# Patient Record
Sex: Female | Born: 1990 | Race: Black or African American | Hispanic: No | Marital: Single | Smoking: Current some day smoker
Health system: Southern US, Community
[De-identification: ages and names within clinical notes are randomized; demographics above are authoritative.]

## PROBLEM LIST (undated history)

## (undated) DIAGNOSIS — K2970 Gastritis, unspecified, without bleeding: Secondary | ICD-10-CM

## (undated) DIAGNOSIS — B009 Herpesviral infection, unspecified: Secondary | ICD-10-CM

## (undated) DIAGNOSIS — T7840XA Allergy, unspecified, initial encounter: Secondary | ICD-10-CM

## (undated) HISTORY — PX: NO PAST SURGERIES: SHX2092

## (undated) HISTORY — DX: Allergy, unspecified, initial encounter: T78.40XA

## (undated) HISTORY — DX: Herpesviral infection, unspecified: B00.9

---
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
## 2013-08-11 ENCOUNTER — Emergency Department (HOSPITAL_COMMUNITY)
Admission: EM | Admit: 2013-08-11 | Discharge: 2013-08-12 | Disposition: A | Discharge status: Home / Self Care | Payer: Medicaid Other | Attending: Emergency Medicine | Admitting: Emergency Medicine

## 2013-08-11 ENCOUNTER — Encounter (HOSPITAL_COMMUNITY): Payer: Self-pay | Admitting: Emergency Medicine

## 2013-08-11 DIAGNOSIS — R112 Nausea with vomiting, unspecified: Secondary | ICD-10-CM

## 2013-08-11 DIAGNOSIS — Z3202 Encounter for pregnancy test, result negative: Secondary | ICD-10-CM | POA: Insufficient documentation

## 2013-08-11 DIAGNOSIS — R197 Diarrhea, unspecified: Secondary | ICD-10-CM | POA: Insufficient documentation

## 2013-08-11 DIAGNOSIS — F17200 Nicotine dependence, unspecified, uncomplicated: Secondary | ICD-10-CM | POA: Insufficient documentation

## 2013-08-11 DIAGNOSIS — R1013 Epigastric pain: Secondary | ICD-10-CM | POA: Insufficient documentation

## 2013-08-11 DIAGNOSIS — R109 Unspecified abdominal pain: Secondary | ICD-10-CM

## 2013-08-11 LAB — CBC WITH DIFFERENTIAL/PLATELET
Basophils Absolute: 0 10*3/uL (ref 0.0–0.1)
Basophils Relative: 0 % (ref 0–1)
Eosinophils Absolute: 0 10*3/uL (ref 0.0–0.7)
Eosinophils Relative: 0 % (ref 0–5)
HCT: 39 % (ref 36.0–46.0)
Lymphocytes Relative: 6 % — ABNORMAL LOW (ref 12–46)
Lymphs Abs: 0.5 10*3/uL — ABNORMAL LOW (ref 0.7–4.0)
MCH: 28.1 pg (ref 26.0–34.0)
MCHC: 33.3 g/dL (ref 30.0–36.0)
MCV: 84.2 fL (ref 78.0–100.0)
Monocytes Absolute: 0.3 10*3/uL (ref 0.1–1.0)
Neutrophils Relative %: 90 % — ABNORMAL HIGH (ref 43–77)
RDW: 13.5 % (ref 11.5–15.5)

## 2013-08-11 LAB — URINALYSIS, ROUTINE W REFLEX MICROSCOPIC
Glucose, UA: NEGATIVE mg/dL
Hgb urine dipstick: NEGATIVE
Leukocytes, UA: NEGATIVE
Protein, ur: NEGATIVE mg/dL
Specific Gravity, Urine: 1.029 (ref 1.005–1.030)
Urobilinogen, UA: 1 mg/dL (ref 0.0–1.0)
pH: 7.5 (ref 5.0–8.0)

## 2013-08-11 LAB — COMPREHENSIVE METABOLIC PANEL
ALT: 10 U/L (ref 0–35)
AST: 13 U/L (ref 0–37)
Albumin: 4 g/dL (ref 3.5–5.2)
BUN: 12 mg/dL (ref 6–23)
CO2: 24 mEq/L (ref 19–32)
Calcium: 9.1 mg/dL (ref 8.4–10.5)
Creatinine, Ser: 0.68 mg/dL (ref 0.50–1.10)
GFR calc non Af Amer: >90 mL/min (ref >90)

## 2013-08-11 LAB — POCT PREGNANCY, URINE: Preg Test, Ur: NEGATIVE

## 2013-08-11 MED ORDER — ONDANSETRON 4 MG PO TBDP
4.0000 mg | ORAL_TABLET | Freq: Once | ORAL | Status: AC
  Administered 2013-08-11: 4 mg via ORAL
  Filled 2013-08-11: qty 1

## 2013-08-11 MED ORDER — KETOROLAC TROMETHAMINE 30 MG/ML IJ SOLN
60.0000 mg | Freq: Once | INTRAMUSCULAR | Status: AC
  Administered 2013-08-12: 60 mg via INTRAMUSCULAR
  Filled 2013-08-11: qty 2

## 2013-08-11 MED ORDER — DICYCLOMINE HCL 10 MG PO CAPS
20.0000 mg | ORAL_CAPSULE | Freq: Once | ORAL | Status: AC
  Administered 2013-08-12: 20 mg via ORAL
  Filled 2013-08-11: qty 2

## 2013-08-11 MED ORDER — ONDANSETRON HCL 4 MG/2ML IJ SOLN: 4.0000 mg | Freq: Once | INTRAMUSCULAR | Status: DC

## 2013-08-11 MED ORDER — DICYCLOMINE HCL 10 MG/ML IM SOLN: 20.0000 mg | Freq: Once | INTRAMUSCULAR | Status: DC

## 2013-08-11 MED ORDER — GI COCKTAIL ~~LOC~~
30.0000 mL | Freq: Once | ORAL | Status: AC
  Administered 2013-08-12: 30 mL via ORAL
  Filled 2013-08-11: qty 30

## 2013-08-11 NOTE — ED Notes (Signed)
Pt c/o epigastric pain with nausea vomiting and diarrhea.  Onset approx 10am today.   St's pain radiates through to back.  Pt st's she ate spicy wings at 1:00am today.

## 2013-08-12 MED ORDER — ONDANSETRON 8 MG PO TBDP: 8.0000 mg | ORAL_TABLET | Freq: Three times a day (TID) | ORAL | Status: DC | PRN

## 2013-08-12 MED ORDER — DICYCLOMINE HCL 20 MG PO TABS: 20.0000 mg | ORAL_TABLET | Freq: Four times a day (QID) | ORAL | Status: DC | PRN

## 2013-08-12 NOTE — ED Notes (Signed)
Pt given cup of ginger ale for fluid challenge. Tolerating well at this moment but will re-assess in 15 minutes.

## 2013-08-12 NOTE — ED Provider Notes (Signed)
CSN: [id number]     Arrival date & time 08/11/13  1940 History   First MD Initiated Contact with Patient 08/11/13 2301     Chief Complaint  Patient presents with  . Abdominal Pain   (Consider location/radiation/quality/duration/timing/severity/associated sxs/prior Treatment) HPI 22 year old female presents to emergency room with complaint of diffuse abdominal pain, nausea, vomiting, and diarrhea.  Today.  Patient reports she had hot wings.  Last night at 1 AM, was not able to complete all of them.  After waking at 10 AM she developed symptoms.  Pain is crampy in nature, radiates through to her back.  She denies any fever or chills.  No known sick contacts.  No urinary symptoms, no vaginal complaints.  Nausea has resolved with ODT Zofran given in triage. Past Medical History  Diagnosis Date  . Allergy    History reviewed. No pertinent past surgical history. Family History  Problem Relation Age of Onset  . Hypertension Mother   . Diabetes Maternal Grandmother   . Hypertension Maternal Grandmother    History  Substance Use Topics  . Smoking status: Current Some Day Smoker -- 0.10 packs/day    Types: Cigars  . Smokeless tobacco: Not on file     Comment: Smokes Black and Milds  . Alcohol Use: No   OB History   Grav Para Term Preterm Abortions TAB SAB Ect Mult Living                 Review of Systems  All other systems reviewed and are negative.    See History of Present Illness; otherwise all other systems are reviewed and negative Allergies  Review of patient's allergies indicates no known allergies.  Home Medications  No current outpatient prescriptions on file. BP 112/61  Pulse 69  Temp(Src) 98.3 F (36.8 C) (Oral)  Resp 18  SpO2 100% Physical Exam  Nursing note and vitals reviewed. Constitutional: She is oriented to person, place, and time. She appears well-developed and well-nourished.  HENT:  Head: Normocephalic and atraumatic.  Nose: Nose normal.   Mouth/Throat: Oropharynx is clear and moist.  Eyes: Conjunctivae and EOM are normal. Pupils are equal, round, and reactive to light.  Neck: Normal range of motion. Neck supple. No JVD present. No tracheal deviation present. No thyromegaly present.  Cardiovascular: Normal rate, regular rhythm, normal heart sounds and intact distal pulses.  Exam reveals no gallop and no friction rub.   No murmur heard. Pulmonary/Chest: Effort normal and breath sounds normal. No stridor. No respiratory distress. She has no wheezes. She has no rales. She exhibits no tenderness.  Abdominal: Soft. Bowel sounds are normal. She exhibits no distension and no mass. There is tenderness (mild diffuse abdominal pain). There is no rebound and no guarding.  Musculoskeletal: Normal range of motion. She exhibits no edema and no tenderness.  Lymphadenopathy:    She has no cervical adenopathy.  Neurological: She is alert and oriented to person, place, and time. She exhibits normal muscle tone. Coordination normal.  Skin: Skin is warm and dry. No rash noted. No erythema. No pallor.  Psychiatric: She has a normal mood and affect. Her behavior is normal. Judgment and thought content normal.    ED Course  Procedures (including critical care time) Labs Review Labs Reviewed  CBC WITH DIFFERENTIAL - Abnormal; Notable for the following:    Neutrophils Relative % 90 (*)    Lymphocytes Relative 6 (*)    Lymphs Abs 0.5 (*)    All other components within normal  limits  URINALYSIS, ROUTINE W REFLEX MICROSCOPIC - Abnormal; Notable for the following:    Ketones, ur 40 (*)    All other components within normal limits  COMPREHENSIVE METABOLIC PANEL  POCT PREGNANCY, URINE   Imaging Review No results found.  EKG Interpretation    Date/Time:  Wednesday August 11 2013 19:46:14 EST Ventricular Rate:  70 PR Interval:  162 QRS Duration: 84 QT Interval:  400 QTC Calculation: 432 R Axis:   53 Text Interpretation:  Normal sinus  rhythm with sinus arrhythmia Cannot rule out Anterior infarct , age undetermined Abnormal ECG No old tracing to compare Confirmed by Lahna Nath  MD, Wylie Coon ([id number]) on 08/11/2013 11:05:20 PM            MDM   1. Nausea vomiting and diarrhea   2. Abdominal cramping    21 year old female with nausea, vomiting, and diarrhea.  Labs are unremarkable.  She has mild ketones in her urine.  Plan for until for abdominal cramping, by mouth challenge.  At home with same.    Olivia Mackie, MD 08/12/13 [phone]

## 2013-08-12 NOTE — ED Notes (Signed)
Pt tolerating oral fluids well, denies nausea/vomiting. Dr. Norlene Campbell made aware.

## 2014-03-09 ENCOUNTER — Emergency Department (HOSPITAL_COMMUNITY)
Admission: EM | Admit: 2014-03-09 | Discharge: 2014-03-09 | Disposition: A | Discharge status: Home / Self Care | Payer: BC Managed Care – PPO | Attending: Emergency Medicine | Admitting: Emergency Medicine

## 2014-03-09 ENCOUNTER — Emergency Department (HOSPITAL_COMMUNITY): Payer: BC Managed Care – PPO

## 2014-03-09 ENCOUNTER — Encounter (HOSPITAL_COMMUNITY): Payer: Self-pay | Admitting: Emergency Medicine

## 2014-03-09 DIAGNOSIS — K2990 Gastroduodenitis, unspecified, without bleeding: Principal | ICD-10-CM

## 2014-03-09 DIAGNOSIS — F17200 Nicotine dependence, unspecified, uncomplicated: Secondary | ICD-10-CM | POA: Insufficient documentation

## 2014-03-09 DIAGNOSIS — K2970 Gastritis, unspecified, without bleeding: Secondary | ICD-10-CM | POA: Insufficient documentation

## 2014-03-09 DIAGNOSIS — R079 Chest pain, unspecified: Secondary | ICD-10-CM | POA: Insufficient documentation

## 2014-03-09 LAB — URINALYSIS, ROUTINE W REFLEX MICROSCOPIC
Bilirubin Urine: NEGATIVE
Glucose, UA: NEGATIVE mg/dL
Hgb urine dipstick: NEGATIVE
Ketones, ur: 15 mg/dL — AB
Nitrite: NEGATIVE
Protein, ur: NEGATIVE mg/dL
Specific Gravity, Urine: 1.02 (ref 1.005–1.030)
Urobilinogen, UA: 1 mg/dL (ref 0.0–1.0)
pH: 7.5 (ref 5.0–8.0)

## 2014-03-09 LAB — COMPREHENSIVE METABOLIC PANEL WITH GFR
ALT: 12 U/L (ref 0–35)
AST: 19 U/L (ref 0–37)
Albumin: 4.4 g/dL (ref 3.5–5.2)
Alkaline Phosphatase: 54 U/L (ref 39–117)
Anion gap: 22 — ABNORMAL HIGH (ref 5–15)
BUN: 8 mg/dL (ref 6–23)
CO2: 18 meq/L — ABNORMAL LOW (ref 19–32)
Calcium: 9.3 mg/dL (ref 8.4–10.5)
Chloride: 100 meq/L (ref 96–112)
Creatinine, Ser: 0.72 mg/dL (ref 0.50–1.10)
GFR calc Af Amer: >90 mL/min
GFR calc non Af Amer: >90 mL/min
Glucose, Bld: 143 mg/dL — ABNORMAL HIGH (ref 70–99)
Potassium: 3.4 meq/L — ABNORMAL LOW (ref 3.7–5.3)
Sodium: 140 meq/L (ref 137–147)
Total Bilirubin: 0.3 mg/dL (ref 0.3–1.2)
Total Protein: 8.2 g/dL (ref 6.0–8.3)

## 2014-03-09 LAB — URINE MICROSCOPIC-ADD ON

## 2014-03-09 LAB — CBC
HCT: 36.8 % (ref 36.0–46.0)
Hemoglobin: 11.7 g/dL — ABNORMAL LOW (ref 12.0–15.0)
MCH: 26.5 pg (ref 26.0–34.0)
MCHC: 31.8 g/dL (ref 30.0–36.0)
MCV: 83.3 fL (ref 78.0–100.0)
Platelets: 356 10*3/uL (ref 150–400)
RBC: 4.42 MIL/uL (ref 3.87–5.11)
RDW: 13.6 % (ref 11.5–15.5)
WBC: 11.6 10*3/uL — ABNORMAL HIGH (ref 4.0–10.5)

## 2014-03-09 LAB — LIPASE, BLOOD: Lipase: 14 U/L (ref 11–59)

## 2014-03-09 LAB — POC URINE PREG, ED: Preg Test, Ur: NEGATIVE

## 2014-03-09 LAB — I-STAT TROPONIN, ED: Troponin i, poc: 0 ng/mL (ref 0.00–0.08)

## 2014-03-09 MED ORDER — METOCLOPRAMIDE HCL 5 MG/ML IJ SOLN
10.0000 mg | Freq: Once | INTRAMUSCULAR | Status: AC
  Administered 2014-03-09: 10 mg via INTRAVENOUS
  Filled 2014-03-09: qty 2

## 2014-03-09 MED ORDER — ONDANSETRON HCL 4 MG/2ML IJ SOLN
4.0000 mg | Freq: Once | INTRAMUSCULAR | Status: AC
  Administered 2014-03-09: 4 mg via INTRAVENOUS
  Filled 2014-03-09: qty 2

## 2014-03-09 MED ORDER — RANITIDINE HCL 150 MG PO TABS: 150.0000 mg | ORAL_TABLET | Freq: Two times a day (BID) | ORAL | Status: DC

## 2014-03-09 MED ORDER — SODIUM CHLORIDE 0.9 % IV BOLUS (SEPSIS)
1000.0000 mL | Freq: Once | INTRAVENOUS | Status: AC
  Administered 2014-03-09: 1000 mL via INTRAVENOUS

## 2014-03-09 MED ORDER — SUCRALFATE 1 GM/10ML PO SUSP: 1.0000 g | Freq: Three times a day (TID) | ORAL | Status: DC

## 2014-03-09 MED ORDER — DIPHENHYDRAMINE HCL 50 MG/ML IJ SOLN
25.0000 mg | Freq: Once | INTRAMUSCULAR | Status: AC
  Administered 2014-03-09: 25 mg via INTRAVENOUS
  Filled 2014-03-09: qty 1

## 2014-03-09 MED ORDER — PROMETHAZINE HCL 25 MG PO TABS: 25.0000 mg | ORAL_TABLET | Freq: Four times a day (QID) | ORAL | Status: DC | PRN

## 2014-03-09 MED ORDER — HYDROCODONE-ACETAMINOPHEN 5-325 MG PO TABS: 2.0000 | ORAL_TABLET | ORAL | Status: DC | PRN

## 2014-03-09 MED ORDER — HYDROMORPHONE HCL PF 1 MG/ML IJ SOLN
1.0000 mg | Freq: Once | INTRAMUSCULAR | Status: AC
  Administered 2014-03-09: 1 mg via INTRAVENOUS
  Filled 2014-03-09: qty 1

## 2014-03-09 NOTE — ED Notes (Signed)
Patient transported to Ultrasound 

## 2014-03-09 NOTE — ED Provider Notes (Signed)
CSN: [id number]     Arrival date & time 03/09/14  1101 History   First MD Initiated Contact with Patient 03/09/14 1116     Chief Complaint  Patient presents with  . Chest Pain  . Nausea     (Consider location/radiation/quality/duration/timing/severity/associated sxs/prior Treatment) HPI Comments: Patient presents to the ER for evaluation of nausea, vomiting, diarrhea with abdominal pain. Patient says she did go out last night and drank a hard lemonade. Around 2 AM she woke up with upper abdominal pain. She had nausea and vomiting through the night. It radiates up into the chest. Upon arrival to the ER, continues to have sharp, stabbing pain in the upper abdomen. No diarrhea or constipation. No blood in vomit, no melena. She has not had a fever.  Patient is a 23 y.o. female presenting with chest pain.  Chest Pain Associated symptoms: abdominal pain, nausea and vomiting     Past Medical History  Diagnosis Date  . Allergy    History reviewed. No pertinent past surgical history. Family History  Problem Relation Age of Onset  . Hypertension Mother   . Diabetes Maternal Grandmother   . Hypertension Maternal Grandmother    History  Substance Use Topics  . Smoking status: Current Some Day Smoker -- 0.10 packs/day    Types: Cigarettes  . Smokeless tobacco: Not on file     Comment: Smokes Black and Milds  . Alcohol Use: Yes   OB History   Grav Para Term Preterm Abortions TAB SAB Ect Mult Living                 Review of Systems  Cardiovascular: Positive for chest pain.  Gastrointestinal: Positive for nausea, vomiting and abdominal pain.  All other systems reviewed and are negative.     Allergies  Review of patient's allergies indicates no known allergies.  Home Medications   Prior to Admission medications   Not on File   BP 98/53  Pulse 56  Temp(Src) 97.8 F (36.6 C) (Oral)  Resp 18  SpO2 94%  LMP 02/16/2014 Physical Exam  Constitutional: She is oriented to  person, place, and time. She appears well-developed and well-nourished. No distress.  HENT:  Head: Normocephalic and atraumatic.  Right Ear: Hearing normal.  Left Ear: Hearing normal.  Nose: Nose normal.  Mouth/Throat: Oropharynx is clear and moist and mucous membranes are normal.  Eyes: Conjunctivae and EOM are normal. Pupils are equal, round, and reactive to light.  Neck: Normal range of motion. Neck supple.  Cardiovascular: Regular rhythm, S1 normal and S2 normal.  Exam reveals no gallop and no friction rub.   No murmur heard. Pulmonary/Chest: Effort normal and breath sounds normal. No respiratory distress. She exhibits no tenderness.  Abdominal: Soft. Normal appearance and bowel sounds are normal. There is no hepatosplenomegaly. There is tenderness in the epigastric area. There is no rebound, no guarding, no tenderness at McBurney's point and negative Murphy's sign. No hernia.  Musculoskeletal: Normal range of motion.  Neurological: She is alert and oriented to person, place, and time. She has normal strength. No cranial nerve deficit or sensory deficit. Coordination normal. GCS eye subscore is 4. GCS verbal subscore is 5. GCS motor subscore is 6.  Skin: Skin is warm, dry and intact. No rash noted. No cyanosis.  Psychiatric: She has a normal mood and affect. Her speech is normal and behavior is normal. Thought content normal.    ED Course  Procedures (including critical care time) Labs Review Labs Reviewed  CBC - Abnormal; Notable for the following:    WBC 11.6 (*)    Hemoglobin 11.7 (*)    All other components within normal limits  COMPREHENSIVE METABOLIC PANEL - Abnormal; Notable for the following:    Potassium 3.4 (*)    CO2 18 (*)    Glucose, Bld 143 (*)    Anion gap 22 (*)    All other components within normal limits  URINALYSIS, ROUTINE W REFLEX MICROSCOPIC - Abnormal; Notable for the following:    APPearance CLOUDY (*)    Ketones, ur 15 (*)    Leukocytes, UA TRACE (*)     All other components within normal limits  URINE MICROSCOPIC-ADD ON - Abnormal; Notable for the following:    Bacteria, UA FEW (*)    All other components within normal limits  LIPASE, BLOOD  I-STAT TROPOININ, ED  POC URINE PREG, ED    Imaging Review [place] Abdomen Complete  03/09/2014   CLINICAL DATA:  Chest pain and nausea.  EXAM: ULTRASOUND ABDOMEN COMPLETE  COMPARISON:  None.  FINDINGS: Gallbladder:  No gallstones or wall thickening visualized. No sonographic Murphy sign noted.  Common bile duct:  Diameter: 0.4 cm  Liver:  No focal lesion identified. Within normal limits in parenchymal echogenicity.  IVC:  No abnormality visualized.  Pancreas:  Visualized portion unremarkable.  Spleen:  Size and appearance within normal limits.  Right Kidney:  Length: 10.9 cm. Echogenicity within normal limits. No mass or hydronephrosis visualized.  Left Kidney:  Length: 10.7 cm. Echogenicity within normal limits. No mass or hydronephrosis visualized.  Abdominal aorta:  No aneurysm visualized.  Other findings:  None.  IMPRESSION: Negative for gallstones.  Negative exam.   Electronically Signed   By: Drusilla Kannerhomas  Dalessio M.D.   On: 03/09/2014 15:19   Dg Abd Acute W/chest  03/09/2014   CLINICAL DATA:  Abdominal pain, vomiting  EXAM: ACUTE ABDOMEN SERIES (ABDOMEN 2 VIEW & CHEST 1 VIEW)  COMPARISON:  None.  FINDINGS: There is no evidence of dilated bowel loops or free intraperitoneal air. No radiopaque calculi or other significant radiographic abnormality is seen. Heart size and mediastinal contours are within normal limits. Both lungs are clear.  IMPRESSION: Negative abdominal radiographs.  No acute cardiopulmonary disease.   Electronically Signed   By: Charlett NoseKevin  Dover M.D.   On: 03/09/2014 12:19     EKG Interpretation   Date/Time:  Wednesday March 09 2014 11:08:39 EDT Ventricular Rate:  68 PR Interval:  174 QRS Duration: 86 QT Interval:  438 QTC Calculation: 465 R Axis:   79 Text Interpretation:  Normal sinus  rhythm with sinus arrhythmia Normal ECG  Confirmed by POLLINA  MD, CHRISTOPHER ([id number]) on 03/09/2014 11:44:19 AM      MDM   Final diagnoses:  None   vomiting  Abdominal pain  Patient presents to the ER for evaluation of abdominal and chest pain. Patient admits to drinking small amounts of alcohol last night prior to onset of symptoms. Patient has mild epigastric tenderness upon examination here in the ER. Patient labs were unremarkable. Repeat examination revealed continued pain epigastric and right upper quadrant so ultrasound is performed. No acute gallbladder disease noted. Patient likely suffering from gastritis or possibly gatroenteritis. Patient is safe for discharge with continued symptomatic treatment.    Gilda Creasehristopher J. Pollina, MD 03/09/14 1539

## 2014-03-09 NOTE — ED Notes (Signed)
She states "i drank one mikes hard lemonade last night and then i woke up with nausea then with chest pain then with numbness in my body.'

## 2014-03-09 NOTE — Discharge Instructions (Signed)

## 2015-04-09 ENCOUNTER — Inpatient Hospital Stay (HOSPITAL_COMMUNITY)
Admission: AD | Admit: 2015-04-09 | Discharge: 2015-04-09 | Disposition: A | Discharge status: Home / Self Care | Payer: Self-pay | Source: Ambulatory Visit | Attending: Obstetrics & Gynecology | Admitting: Obstetrics & Gynecology

## 2015-04-09 ENCOUNTER — Encounter (HOSPITAL_COMMUNITY): Payer: Self-pay

## 2015-04-09 DIAGNOSIS — N939 Abnormal uterine and vaginal bleeding, unspecified: Secondary | ICD-10-CM | POA: Insufficient documentation

## 2015-04-09 DIAGNOSIS — N644 Mastodynia: Secondary | ICD-10-CM | POA: Insufficient documentation

## 2015-04-09 DIAGNOSIS — F17210 Nicotine dependence, cigarettes, uncomplicated: Secondary | ICD-10-CM | POA: Insufficient documentation

## 2015-04-09 DIAGNOSIS — Z3202 Encounter for pregnancy test, result negative: Secondary | ICD-10-CM | POA: Insufficient documentation

## 2015-04-09 HISTORY — DX: Gastritis, unspecified, without bleeding: K29.70

## 2015-04-09 LAB — URINALYSIS, ROUTINE W REFLEX MICROSCOPIC
BILIRUBIN URINE: NEGATIVE
Glucose, UA: NEGATIVE mg/dL
Ketones, ur: NEGATIVE mg/dL
Leukocytes, UA: NEGATIVE
NITRITE: NEGATIVE
PROTEIN: NEGATIVE mg/dL
SPECIFIC GRAVITY, URINE: 1.015 (ref 1.005–1.030)
UROBILINOGEN UA: 2 mg/dL — AB (ref 0.0–1.0)
pH: 7 (ref 5.0–8.0)

## 2015-04-09 LAB — HCG, QUANTITATIVE, PREGNANCY

## 2015-04-09 LAB — URINE MICROSCOPIC-ADD ON

## 2015-04-09 LAB — POCT PREGNANCY, URINE: PREG TEST UR: NEGATIVE

## 2015-04-09 NOTE — Progress Notes (Signed)
Irregular periods usually every 2 months 03/14/15 light unprotected sex 7/31

## 2015-04-09 NOTE — MAU Note (Signed)
Patient had one positive UPT and one negative, breasts sore, having vaginal bleeding, unsure if miscarriage.

## 2015-04-09 NOTE — MAU Provider Note (Signed)
History     CSN: [id number]  Arrival date and time: 04/09/15 1535   First Provider Initiated Contact with Patient 04/09/15 1628      Chief Complaint  Patient presents with  . Vaginal Bleeding   HPI   [name] is a 24 y.o. female [id number] presenting to MAU with vaginal bleeding. She is concerned because she had 2 negative pregnancy tests and one positive.  Last period was July 26 and that was a normal period for her. She usually has a period every 2 months, which is a normal schedule for her.  She is having bleeding similar to her menstrual cycle, however does not always have a period every month.   She denies any other concerns other than some mild, bilateral breast soreness.   OB History    Gravida Para Term Preterm AB TAB SAB Ectopic Multiple Living   0 0 0 0 0 0 0 0 0 0       Past Medical History  Diagnosis Date  . Allergy   . Gastritis     Past Surgical History  Procedure Laterality Date  . No past surgeries      Family History  Problem Relation Age of Onset  . Hypertension Mother   . Diabetes Maternal Grandmother   . Hypertension Maternal Grandmother     Social History  Substance Use Topics  . Smoking status: Current Some Day Smoker -- 0.10 packs/day    Types: Cigarettes  . Smokeless tobacco: None     Comment: Smokes Black and Milds  . Alcohol Use: Yes    Allergies: No Known Allergies  Facility-administered medications prior to admission  Medication Dose Route Frequency Provider Last Rate Last Dose  . hpv vaccine (GARDASIL) injection 0.5 mL  0.5 mL Intramuscular Once Reva Bores, MD       Prescriptions prior to admission  Medication Sig Dispense Refill Last Dose  . ibuprofen (ADVIL,MOTRIN) 200 MG tablet Take 400 mg by mouth every 6 (six) hours as needed for headache.   prn   Results for orders placed or performed during the hospital encounter of 04/09/15 (from the past 48 hour(s))  Urinalysis, Routine w reflex microscopic (not at [facility])      Status: Abnormal   Collection Time: 04/09/15  3:55 PM  Result Value Ref Range   Color, Urine YELLOW YELLOW   APPearance CLEAR CLEAR   Specific Gravity, Urine 1.015 1.005 - 1.030   pH 7.0 5.0 - 8.0   Glucose, UA NEGATIVE NEGATIVE mg/dL   Hgb urine dipstick TRACE (A) NEGATIVE   Bilirubin Urine NEGATIVE NEGATIVE   Ketones, ur NEGATIVE NEGATIVE mg/dL   Protein, ur NEGATIVE NEGATIVE mg/dL   Urobilinogen, UA 2.0 (H) 0.0 - 1.0 mg/dL   Nitrite NEGATIVE NEGATIVE   Leukocytes, UA NEGATIVE NEGATIVE  Urine microscopic-add on     Status: Abnormal   Collection Time: 04/09/15  3:55 PM  Result Value Ref Range   Squamous Epithelial / LPF FEW (A) RARE   WBC, UA 0-2 <3 WBC/hpf   RBC / HPF 3-6 <3 RBC/hpf  Pregnancy, urine POC     Status: None   Collection Time: 04/09/15  4:02 PM  Result Value Ref Range   Preg Test, Ur NEGATIVE NEGATIVE    Comment:        THE SENSITIVITY OF THIS METHODOLOGY IS >24 mIU/mL    Results for orders placed or performed during the hospital encounter of 04/09/15 (from the past 48 hour(s))  Urinalysis, Routine w reflex microscopic (not at [facility])     Status: Abnormal   Collection Time: 04/09/15  3:55 PM  Result Value Ref Range   Color, Urine YELLOW YELLOW   APPearance CLEAR CLEAR   Specific Gravity, Urine 1.015 1.005 - 1.030   pH 7.0 5.0 - 8.0   Glucose, UA NEGATIVE NEGATIVE mg/dL   Hgb urine dipstick TRACE (A) NEGATIVE   Bilirubin Urine NEGATIVE NEGATIVE   Ketones, ur NEGATIVE NEGATIVE mg/dL   Protein, ur NEGATIVE NEGATIVE mg/dL   Urobilinogen, UA 2.0 (H) 0.0 - 1.0 mg/dL   Nitrite NEGATIVE NEGATIVE   Leukocytes, UA NEGATIVE NEGATIVE  Urine microscopic-add on     Status: Abnormal   Collection Time: 04/09/15  3:55 PM  Result Value Ref Range   Squamous Epithelial / LPF FEW (A) RARE   WBC, UA 0-2 <3 WBC/hpf   RBC / HPF 3-6 <3 RBC/hpf  Pregnancy, urine POC     Status: None   Collection Time: 04/09/15  4:02 PM  Result Value Ref Range   Preg Test, Ur NEGATIVE  NEGATIVE    Comment:        THE SENSITIVITY OF THIS METHODOLOGY IS >24 mIU/mL   hCG, quantitative, pregnancy     Status: None   Collection Time: 04/09/15  4:40 PM  Result Value Ref Range   hCG, Beta Chain, Quant, S <1 <5 mIU/mL    Comment:          GEST. AGE      CONC.  (mIU/mL)   <=1 WEEK        5 - 50     2 WEEKS       50 - 500     3 WEEKS       100 - 10,000     4 WEEKS     1,000 - 30,000     5 WEEKS     3,500 - 115,000   6-8 WEEKS     12,000 - 270,000    12 WEEKS     15,000 - 220,000        FEMALE AND NON-PREGNANT FEMALE:     LESS THAN 5 mIU/mL REPEATED TO VERIFY     Review of Systems  Constitutional: Negative for fever and chills.  Gastrointestinal: Negative for nausea and vomiting.  Genitourinary: Negative for dysuria.  Skin:       Nipple soreness    Physical Exam   Blood pressure 130/71, pulse 76, temperature 98.1 F (36.7 C), temperature source Oral, resp. rate 16.  Physical Exam  Constitutional: She is oriented to person, place, and time. She appears well-developed and well-nourished. No distress.  HENT:  Head: Normocephalic.  Eyes: Pupils are equal, round, and reactive to light.  Neck: Neck supple.  Respiratory: Effort normal.  Musculoskeletal: Normal range of motion.  Neurological: She is alert and oriented to person, place, and time.  Skin: Skin is warm. She is not diaphoretic.  Psychiatric: Her behavior is normal.    MAU Course  Procedures  None  MDM  Hcg level negative.    Assessment and Plan   A:  1. Breast tenderness   2. Encounter for pregnancy test with result negative     P:  Discharge home in stable condition Condoms always Return to MAU for emergencies only    Duane Lope, NP 04/09/2015 4:43 PM

## 2015-04-09 NOTE — Discharge Instructions (Signed)
Breast Tenderness Breast tenderness is a common problem for women of all ages. Breast tenderness may cause mild discomfort to severe pain. It has a variety of causes. Your health care provider will find out the likely cause of your breast tenderness by examining your breasts, asking you about symptoms, and ordering some tests. Breast tenderness usually does not mean you have breast cancer. HOME CARE INSTRUCTIONS  Breast tenderness often can be handled at home. You can try:  Getting fitted for a new bra that provides more support, especially during exercise.  Wearing a more supportive bra or sports bra while sleeping when your breasts are very tender.  If you have a breast injury, apply ice to the area:  Put ice in a plastic bag.  Place a towel between your skin and the bag.  Leave the ice on for 20 minutes, 2-3 times a day.  If your breasts are too full of milk as a result of breastfeeding, try:  Expressing milk either by hand or with a breast pump.  Applying a warm compress to the breasts for relief.  Taking over-the-counter pain relievers, if approved by your health care provider.  Taking other medicines that your health care provider prescribes. These may include antibiotic medicines or birth control pills. Over the long term, your breast tenderness might be eased if you:  Cut down on caffeine.  Reduce the amount of fat in your diet. Keep a log of the days and times when your breasts are most tender. This will help you and your health care provider find the cause of the tenderness and how to relieve it. Also, learn how to do breast exams at home. This will help you notice if you have an unusual growth or lump that could cause tenderness. SEEK MEDICAL CARE IF:   Any part of your breast is hard, red, and hot to the touch. This could be a sign of infection.  Fluid is coming out of your nipples (and you are not breastfeeding). Especially watch for blood or pus.  You have a fever  as well as breast tenderness.  You have a new or painful lump in your breast that remains after your menstrual period ends.  You have tried to take care of the pain at home, but it has not gone away.  Your breast pain is getting worse, or the pain is making it hard to do the things you usually do during your day. Document Released: 07/18/2008 Document Revised: 04/07/2013 Document Reviewed: 03/04/2013 [facility] Patient Information 2015 [place], [place]. This information is not intended to replace advice given to you by your health care provider. Make sure you discuss any questions you have with your health care provider.  Human Chorionic Gonadotropin (hCG) This is a test to confirm and monitor pregnancy or to diagnose trophoblastic disease or germ cell tumors. As early as 10 days after a missed menstrual period (some methods can detect hCG even earlier, at one week after conception) or if your caregiver thinks that your symptoms suggest ectopic pregnancy, a failing pregnancy, trophoblastic disease, or germ cell tumors. hCG is a protein produced in the placenta of a pregnant woman. A pregnancy test is a specific blood or urine test that can detect hCG and confirm pregnancy. This hormone is able to be detected 10 days after a missed menstrual period, the time period when the fertilized egg is implanted in the woman's uterus. With some methods, hCG can be detected even earlier, at one week after conception.  During the early weeks of pregnancy, hCG is important in maintaining function of the corpus luteum (the mass of cells that forms from a mature egg). Production of hCG increases steadily during the first trimester (8-10 weeks), peaking around the 10th week after the last menstrual cycle. Levels then fall slowly during the remainder of the pregnancy. hCG is no longer detectable within a few weeks after delivery. hCG is also produced by some germ cell tumors and increased levels are seen in trophoblastic  disease. SAMPLE COLLECTION hCG is commonly detected in urine. The preferred specimen is a random urine sample collected first thing in the morning. hCG can also be measured in blood drawn from a vein in the arm. NORMAL FINDINGS Qualitative:negative in non-pregnant women; positive in pregnancy Quantitative:   Gestation less than 1 week: 5-50 Whole HCG (milli-international units/mL)  Gestation of 2 weeks: 50-500 Whole HCG (milli-international units/mL)  Gestation of 3 weeks: 100-10,000 Whole HCG (milli-international units/mL)  Gestation of 4 weeks: 1,000-30,000 Whole HCG (milli-international units/mL)  Gestation of 5 weeks 3,500-115,000 Whole HCG (milli-international units/mL)  Gestation of 6-8 weeks: 12,000-270,000 Whole HCG (milli-international units/mL)  Gestation of 12 weeks: 15,000-220,000 Whole HCG (milli-international units/mL)  Males and non-pregnant females: less than 5 Whole HCG (milli-international units/mL) Beta subunit: depends on the method and test used Ranges for normal findings may vary among different laboratories and hospitals. You should always check with your doctor after having lab work or other tests done to discuss the meaning of your test results and whether your values are considered within normal limits. MEANING OF TEST  Your caregiver will go over the test results with you and discuss the importance and meaning of your results, as well as treatment options and the need for additional tests if necessary. OBTAINING THE TEST RESULTS It is your responsibility to obtain your test results. Ask the lab or department performing the test when and how you will get your results. Document Released: 09/06/2004 Document Revised: 10/28/2011 Document Reviewed: 11/08/2013 [facility] Patient Information 2015 [place], [place]. This information is not intended to replace advice given to you by your health care provider. Make sure you discuss any questions you have with your health  care provider.

## 2015-11-13 IMAGING — CR DG ABDOMEN ACUTE W/ 1V CHEST
3 series · 3 of 3 positions shown · non-contrast
Comparison: None.

CLINICAL DATA: Abdominal pain, vomiting

EXAM:
ACUTE ABDOMEN SERIES (ABDOMEN 2 VIEW & CHEST 1 VIEW)

[w chest pa]
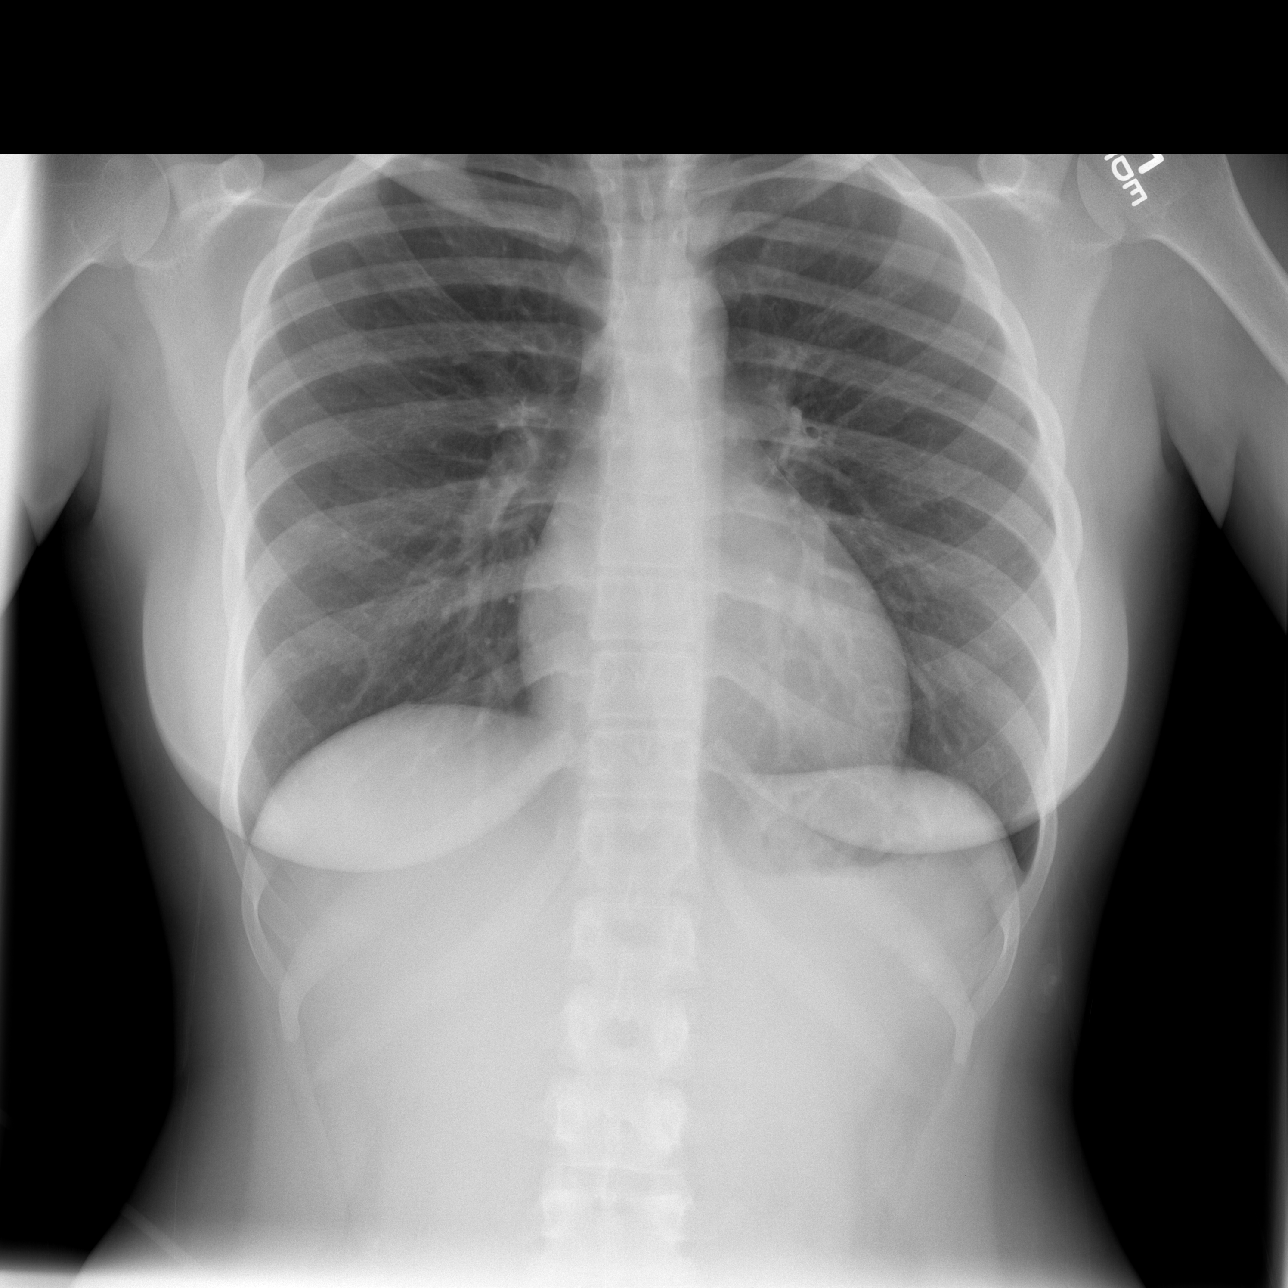

[w abdomen upright]
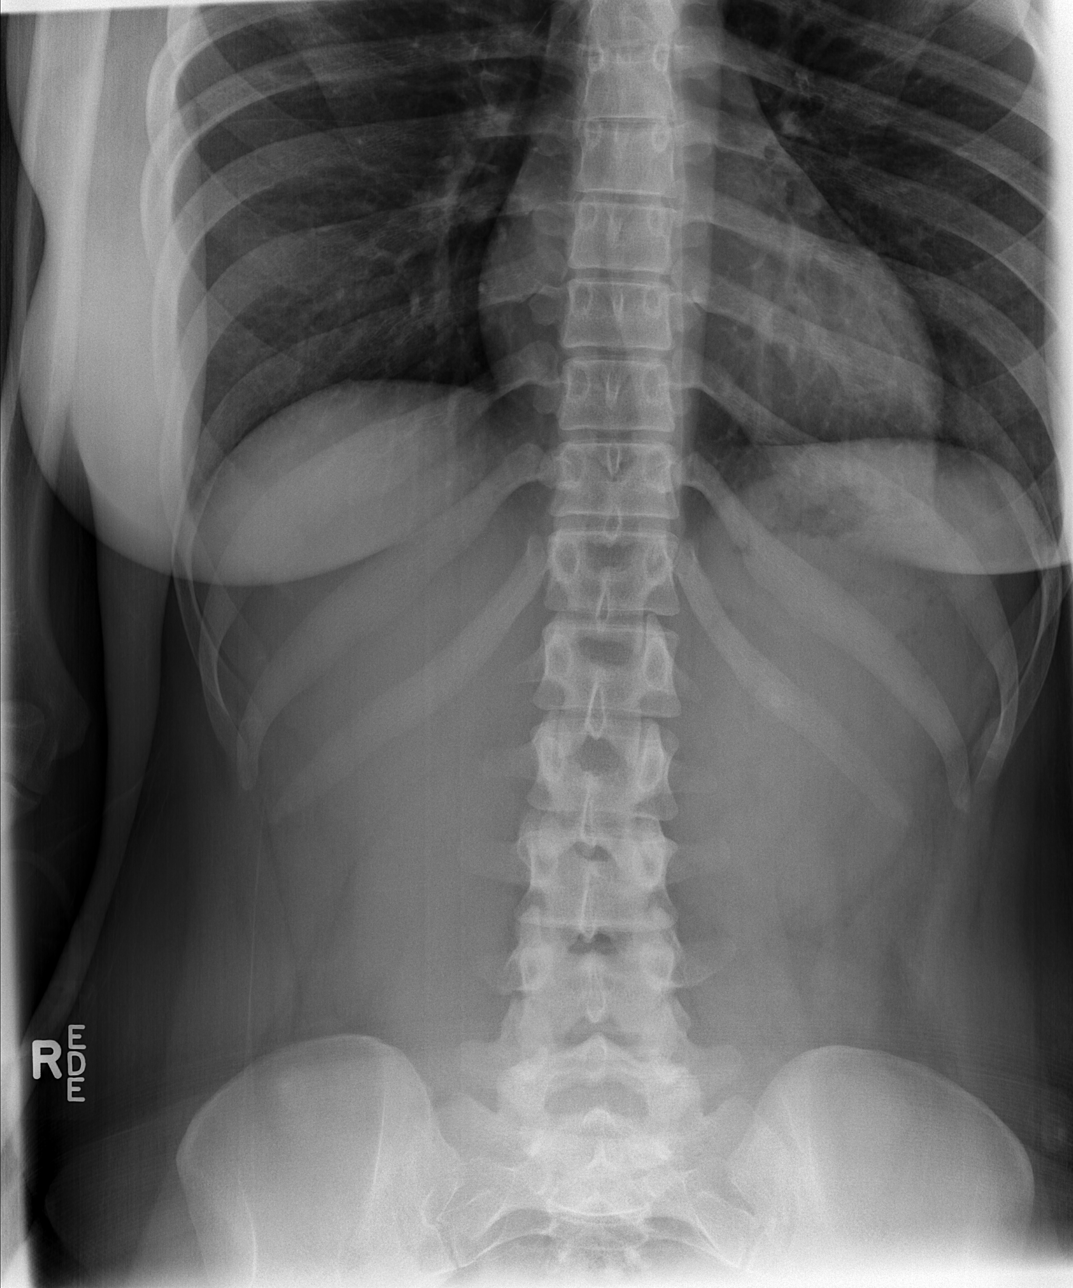

[t abdomen supine]
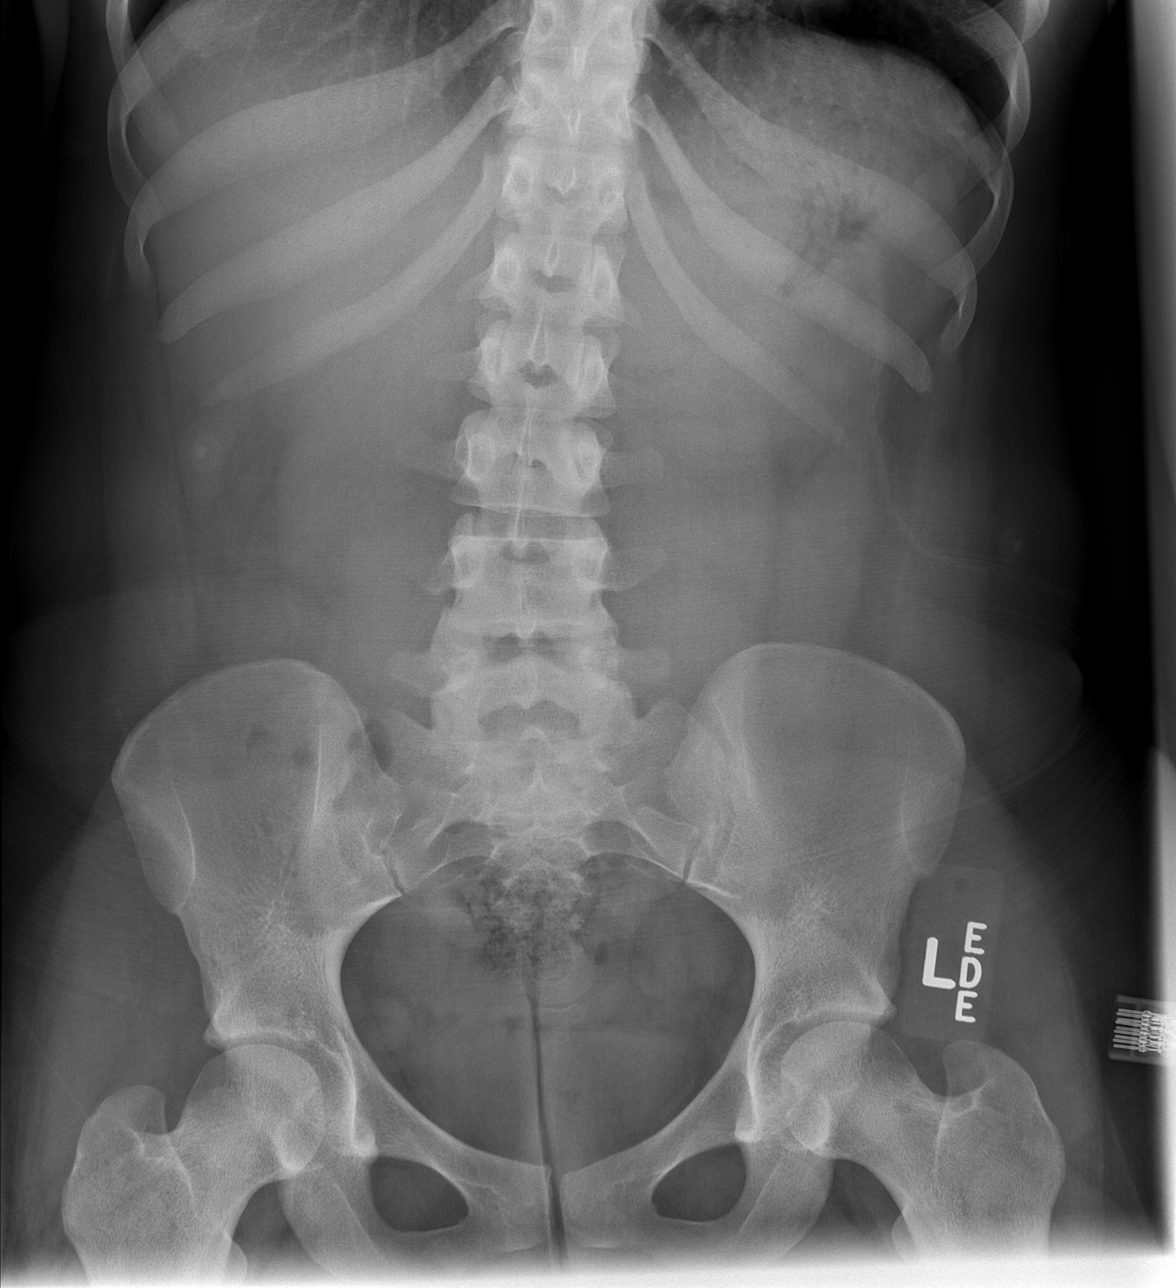

[3 of 3 positions shown; findings below may reference images not displayed]

FINDINGS: There is no evidence of dilated bowel loops or free intraperitoneal
air. No radiopaque calculi or other significant radiographic
abnormality is seen. Heart size and mediastinal contours are within
normal limits. Both lungs are clear.
IMPRESSION: Negative abdominal radiographs.  No acute cardiopulmonary disease.

## 2015-11-13 IMAGING — US US ABDOMEN COMPLETE
1 series · 14 of 25 positions shown · non-contrast
Comparison: None.

CLINICAL DATA: Chest pain and nausea.

EXAM:
ULTRASOUND ABDOMEN COMPLETE

[Series 1: us abdomen complete · 0.27mm/px · 14 of 70 slices shown]
[im 1/70]
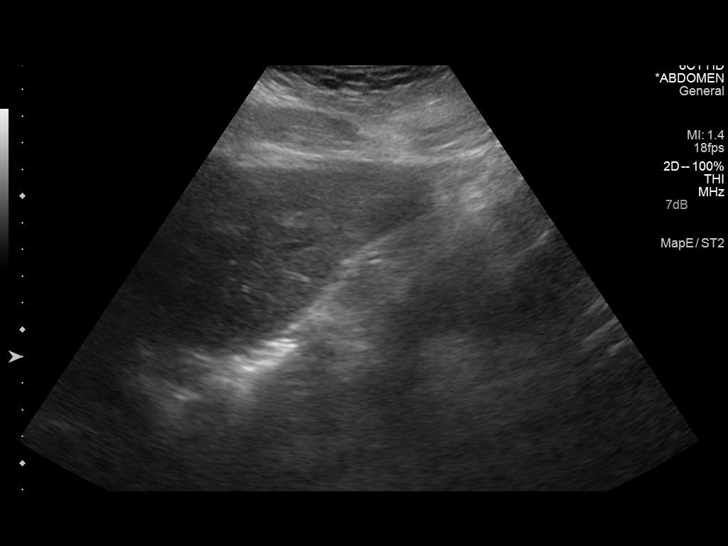
[im 6/70]
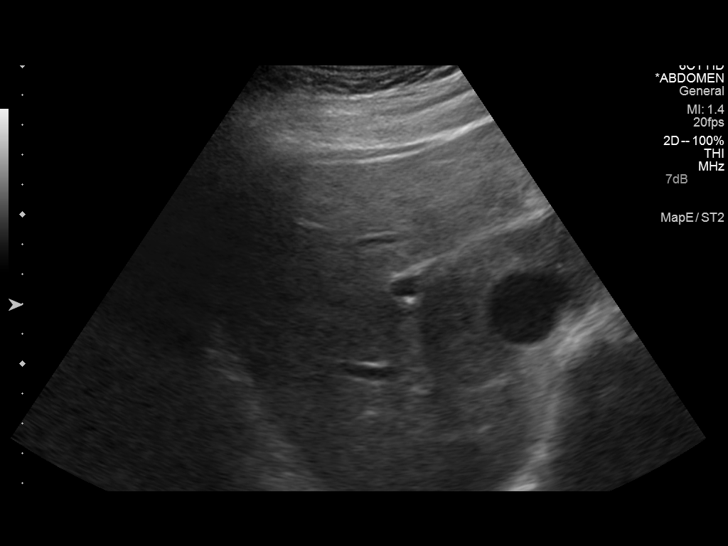
[im 12/70]
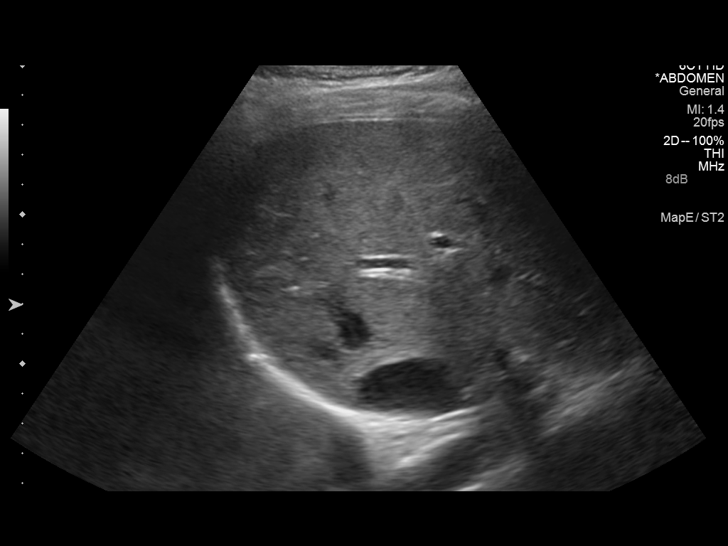
[im 18/70]
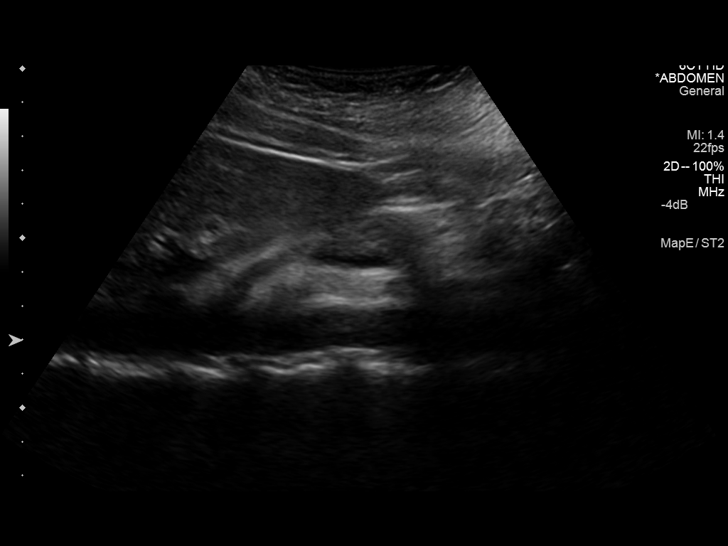
[im 24/70]
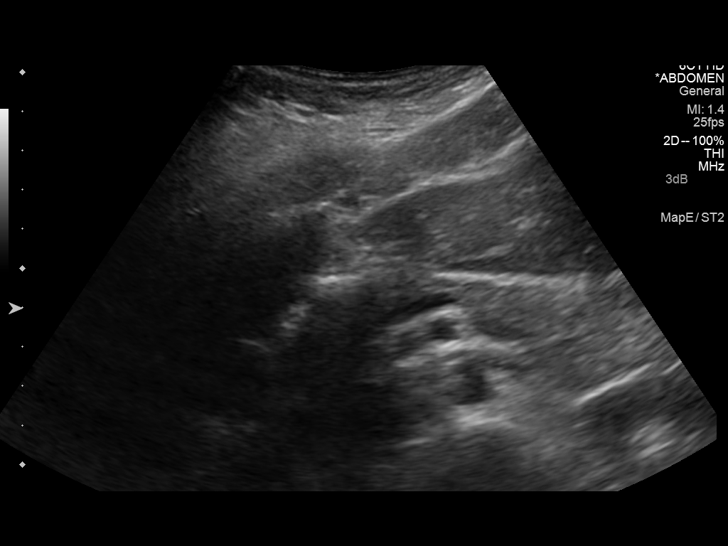
[im 26/70]
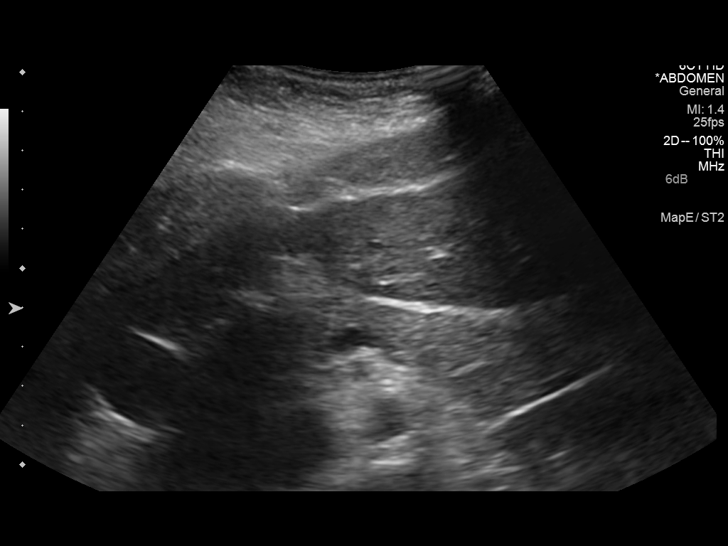
[im 32/70]
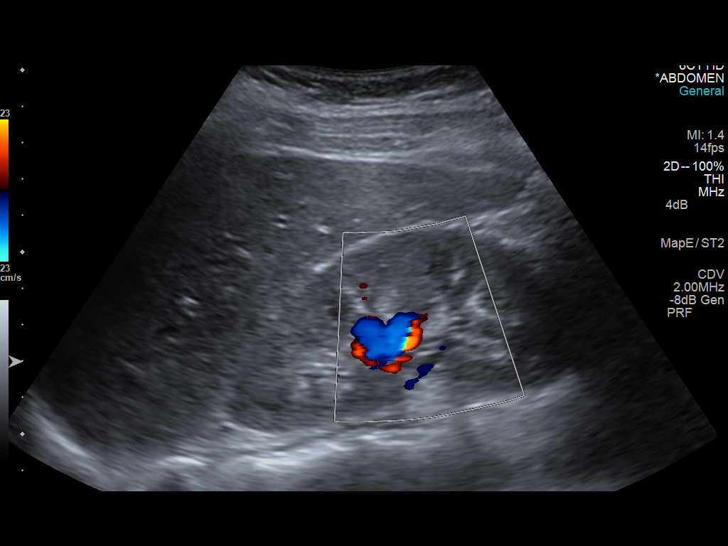
[im 38/70]
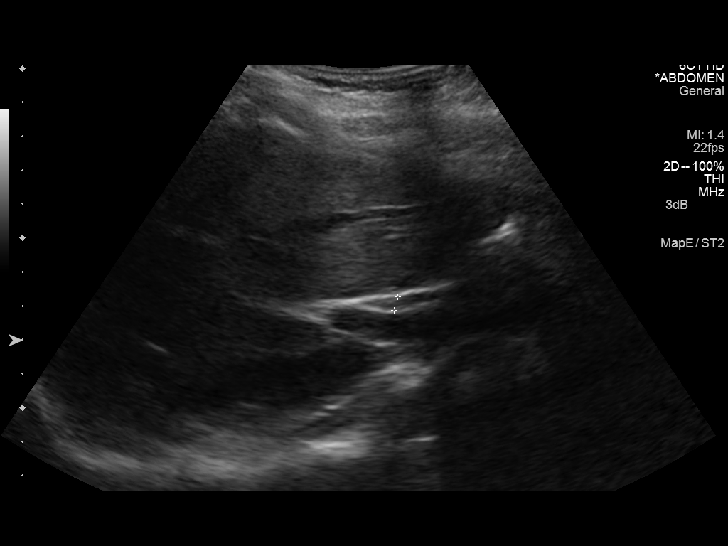
[im 44/70]
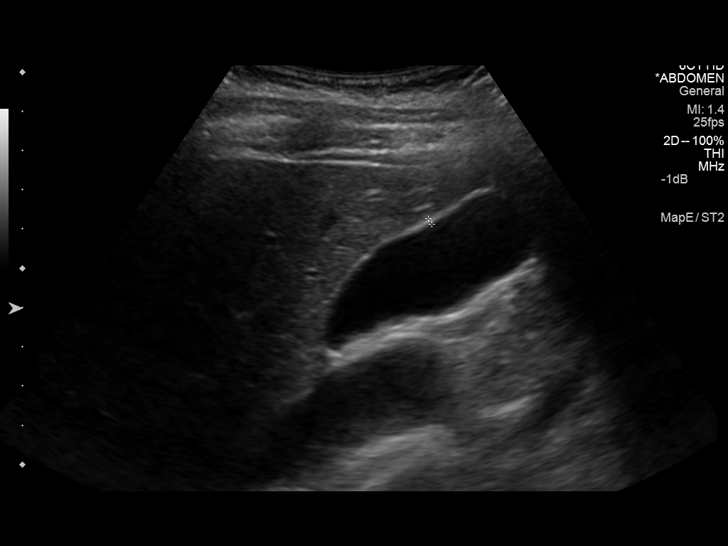
[im 47/70]
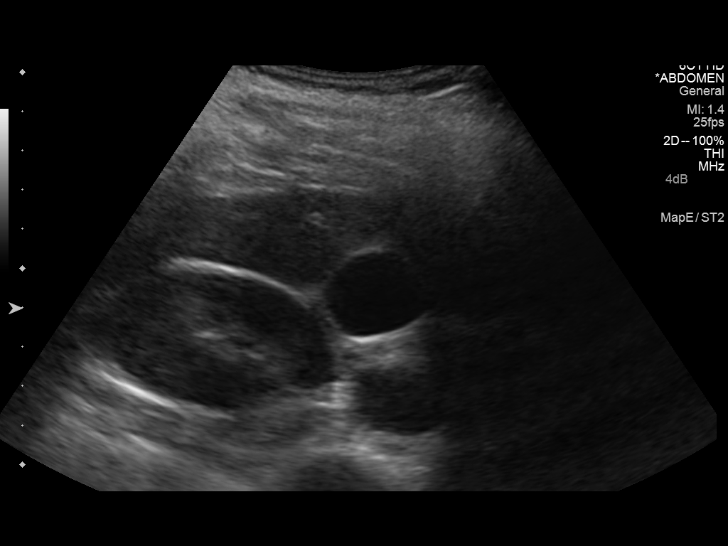
[im 52/70]
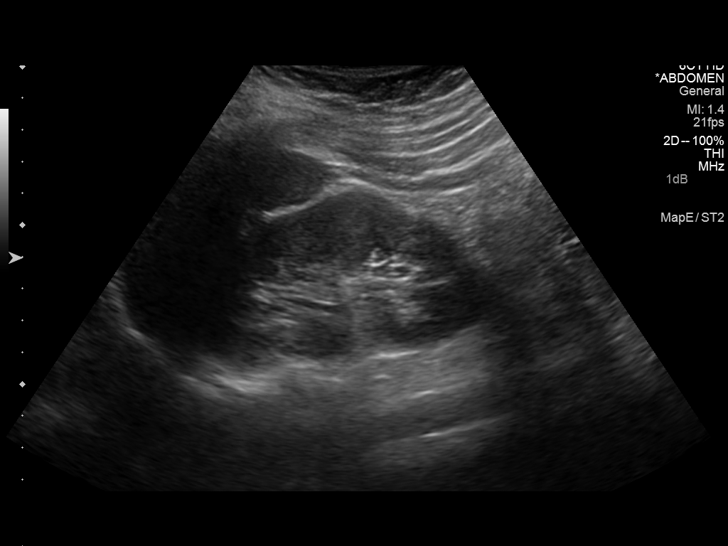
[im 58/70]
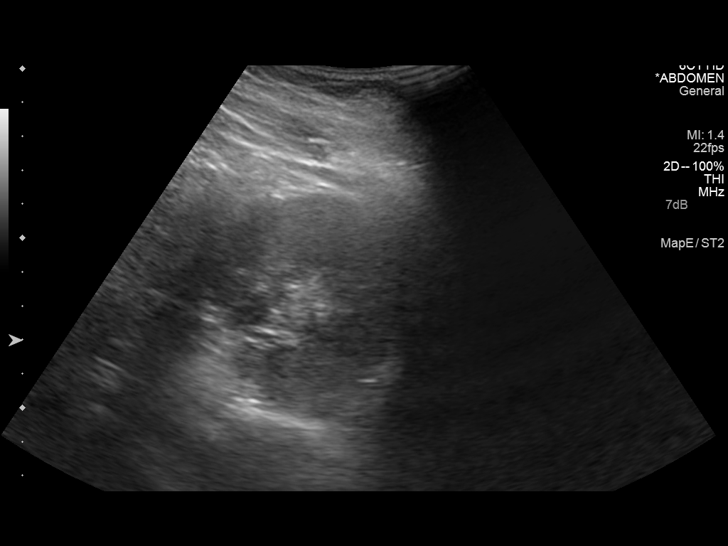
[im 64/70]
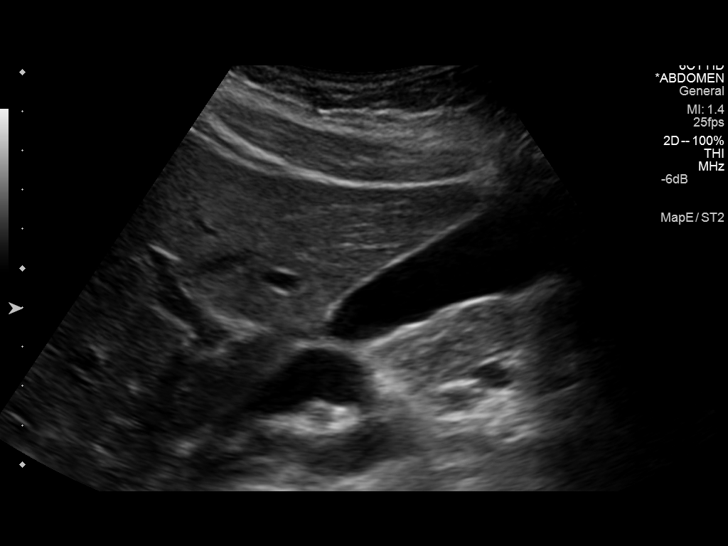
[im 70/70]
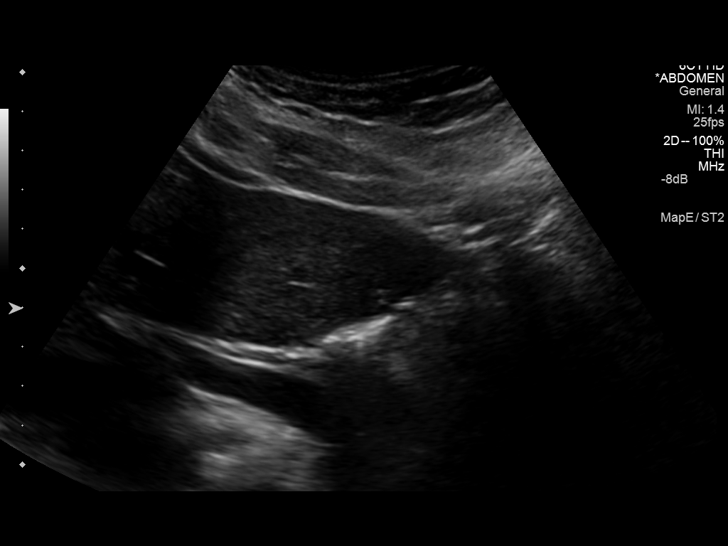

[14 of 25 positions shown; findings below may reference images not displayed]

FINDINGS: Gallbladder:

No gallstones or wall thickening visualized. No sonographic Murphy
sign noted.

Common bile duct:

Diameter: 0.4 cm

Liver:

No focal lesion identified. Within normal limits in parenchymal
echogenicity.

IVC:

No abnormality visualized.

Pancreas:

Visualized portion unremarkable.

Spleen:

Size and appearance within normal limits.

Right Kidney:

Length: 10.9 cm. Echogenicity within normal limits. No mass or
hydronephrosis visualized.

Left Kidney:

Length: 10.7 cm. Echogenicity within normal limits. No mass or
hydronephrosis visualized.

Abdominal aorta:

No aneurysm visualized.

Other findings:

None.
IMPRESSION: Negative for gallstones.  Negative exam.

## 2018-05-06 ENCOUNTER — Ambulatory Visit (HOSPITAL_COMMUNITY): Admission: EM | Admit: 2018-05-06 | Discharge: 2018-05-06 | Discharge status: Against Medical Advice | Payer: Self-pay

## 2018-05-06 NOTE — ED Notes (Signed)
Per pt access, pt left 

## 2018-05-07 ENCOUNTER — Encounter (HOSPITAL_COMMUNITY): Payer: Self-pay

## 2018-05-07 ENCOUNTER — Other Ambulatory Visit: Payer: Self-pay

## 2018-05-07 ENCOUNTER — Ambulatory Visit (HOSPITAL_COMMUNITY)
Admission: EM | Admit: 2018-05-07 | Discharge: 2018-05-07 | Disposition: A | Discharge status: Home / Self Care | Payer: Self-pay | Attending: Family Medicine | Admitting: Family Medicine

## 2018-05-07 DIAGNOSIS — J302 Other seasonal allergic rhinitis: Secondary | ICD-10-CM

## 2018-05-07 MED ORDER — CETIRIZINE HCL 5 MG PO TABS: 5.0000 mg | ORAL_TABLET | Freq: Every day | ORAL | 1 refills | Status: DC

## 2018-05-07 MED ORDER — OLOPATADINE HCL 0.2 % OP SOLN: 1.0000 [drp] | Freq: Once | OPHTHALMIC | 0 refills | Status: AC

## 2018-05-07 NOTE — Discharge Instructions (Signed)
It was nice meeting you!!  I believe your symptoms are allergy related. We will try daily Zyrtec and allergy eyedrops for symptoms. Follow up as needed for continued or worsening symptoms

## 2018-05-07 NOTE — ED Triage Notes (Signed)
Pt states she eyes  have been draining off and on for a month. Pt thinks it her allergies.

## 2018-05-07 NOTE — ED Provider Notes (Signed)
[facility]    CSN: [id number] Arrival date & time: 05/07/18  1112     History   Chief Complaint Chief Complaint  Patient presents with  . Appointment    11 am  . Eye Drainage    HPI Kelli Parks is a 27 y.o. female.   Patient is a 27 year old female with past medical history of allergies.  She presents for bilateral eye itching, watering, swelling.  This is been off and on for a month but worsening over the last week.  She denies any associated URI symptoms.  She denies any fever or purulent drainage from the eyes.  She has not taken anything for her symptoms.  She has taken allergy medicine in the past. She denies any issues with her vision but reports sometimes she has a film over the eye. Denies contact or glasses use.   ROS per HPI      Past Medical History:  Diagnosis Date  . Allergy   . Gastritis     There are no active problems to display for this patient.   Past Surgical History:  Procedure Laterality Date  . NO PAST SURGERIES      OB History    Gravida  0   Para  0   Term  0   Preterm  0   AB  0   Living  0     SAB  0   TAB  0   Ectopic  0   Multiple  0   Live Births               Home Medications    Prior to Admission medications   Medication Sig Start Date End Date Taking? Authorizing Provider  cetirizine (ZYRTEC) 5 MG tablet Take 1 tablet (5 mg total) by mouth daily. 05/07/18   Dahlia Byes A, NP  ibuprofen (ADVIL,MOTRIN) 200 MG tablet Take 400 mg by mouth every 6 (six) hours as needed for headache.    [provider]  Olopatadine HCl 0.2 % SOLN Apply 1 drop to eye once for 1 dose. 05/07/18 05/07/18  Janace Aris, NP    Family History Family History  Problem Relation Age of Onset  . Hypertension Mother   . Diabetes Maternal Grandmother   . Hypertension Maternal Grandmother     Social History Social History   Tobacco Use  . Smoking status: Current Some Day Smoker    Packs/day: 0.10   Types: Cigarettes  . Smokeless tobacco: Never Used  . Tobacco comment: Smokes Black and Milds  Substance Use Topics  . Alcohol use: Yes  . Drug use: Yes    Types: Marijuana     Allergies   Patient has no known allergies.   Review of Systems Review of Systems   Physical Exam Triage Vital Signs ED Triage Vitals  Enc Vitals Group     BP 05/07/18 1120 117/74     Pulse Rate 05/07/18 1120 62     Resp 05/07/18 1120 16     Temp 05/07/18 1120 97.9 F (36.6 C)     Temp Source 05/07/18 1120 Oral     SpO2 05/07/18 1120 100 %     Weight 05/07/18 1123 150 lb (68 kg)     Height --      Head Circumference --      Peak Flow --      Pain Score --      Pain Loc --  Pain Edu? --      Excl. in GC? --    No data found.  Updated Vital Signs BP 117/74 (BP Location: Right Arm)   Pulse 62   Temp 97.9 F (36.6 C) (Oral)   Resp 16   Wt 150 lb (68 kg)   SpO2 100%   BMI 26.57 kg/m   Visual Acuity Right Eye Distance:   Left Eye Distance:   Bilateral Distance:    Right Eye Near:   Left Eye Near:    Bilateral Near:     Physical Exam  Constitutional: She is oriented to person, place, and time. She appears well-developed and well-nourished.  Very pleasant. Non toxic or ill appearing.     HENT:  Head: Normocephalic and atraumatic.  Eyes:  Bilateral eye upper and lower lid puffiness with mild scleral injection and tearing. No purulent drainage.   Neck: Normal range of motion.  Pulmonary/Chest: Effort normal.  Musculoskeletal: Normal range of motion.  Neurological: She is alert and oriented to person, place, and time.  Skin: Skin is warm and dry.  Psychiatric: She has a normal mood and affect.  Nursing note and vitals reviewed.    UC Treatments / Results  Labs (all labs ordered are listed, but only abnormal results are displayed) Labs Reviewed - No data to display  EKG None  Radiology No results found.  Procedures Procedures (including critical care  time)  Medications Ordered in UC Medications - No data to display  Initial Impression / Assessment and Plan / UC Course  I have reviewed the triage vital signs and the nursing notes.  Pertinent labs & imaging results that were available during my care of the patient were reviewed by me and considered in my medical decision making (see chart for details).     Allergies- will treat with pataday drop and zyrtec. No concern for bacterial conjunctivitis.  Follow up as needed for continued or worsening symptoms  Final Clinical Impressions(s) / UC Diagnoses   Final diagnoses:  Seasonal allergies     Discharge Instructions     It was nice meeting you!!  I believe your symptoms are allergy related. We will try daily Zyrtec and allergy eyedrops for symptoms. Follow up as needed for continued or worsening symptoms     ED Prescriptions    Medication Sig Dispense Auth. Provider   Olopatadine HCl 0.2 % SOLN Apply 1 drop to eye once for 1 dose. 1 Bottle Daryll Spisak A, NP   cetirizine (ZYRTEC) 5 MG tablet Take 1 tablet (5 mg total) by mouth daily. 30 tablet Dahlia ByesBast, Carlethia Mesquita A, NP     Controlled Substance Prescriptions [place] Controlled Substance Registry consulted? Not Applicable   Janace ArisBast, Sem Mccaughey A, NP 05/07/18 1147

## 2018-05-07 NOTE — ED Notes (Signed)
Bed: UC01 Expected date:  Expected time:  Means of arrival:  Comments: Appointments 

## 2018-08-11 ENCOUNTER — Emergency Department (HOSPITAL_COMMUNITY)
Admission: EM | Admit: 2018-08-11 | Discharge: 2018-08-11 | Disposition: A | Discharge status: Home / Self Care | Payer: Self-pay | Attending: Emergency Medicine | Admitting: Emergency Medicine

## 2018-08-11 ENCOUNTER — Encounter (HOSPITAL_COMMUNITY): Payer: Self-pay | Admitting: Pharmacy Technician

## 2018-08-11 ENCOUNTER — Other Ambulatory Visit: Payer: Self-pay

## 2018-08-11 DIAGNOSIS — Z79899 Other long term (current) drug therapy: Secondary | ICD-10-CM | POA: Insufficient documentation

## 2018-08-11 DIAGNOSIS — R112 Nausea with vomiting, unspecified: Secondary | ICD-10-CM

## 2018-08-11 DIAGNOSIS — K2900 Acute gastritis without bleeding: Secondary | ICD-10-CM

## 2018-08-11 DIAGNOSIS — R1013 Epigastric pain: Secondary | ICD-10-CM | POA: Insufficient documentation

## 2018-08-11 DIAGNOSIS — E876 Hypokalemia: Secondary | ICD-10-CM

## 2018-08-11 DIAGNOSIS — F17210 Nicotine dependence, cigarettes, uncomplicated: Secondary | ICD-10-CM | POA: Insufficient documentation

## 2018-08-11 LAB — RAPID URINE DRUG SCREEN, HOSP PERFORMED
Amphetamines: NOT DETECTED
BARBITURATES: NOT DETECTED
Benzodiazepines: NOT DETECTED
Cocaine: POSITIVE — AB
Opiates: NOT DETECTED
TETRAHYDROCANNABINOL: POSITIVE — AB

## 2018-08-11 LAB — CBC
HCT: 39.5 % (ref 36.0–46.0)
Hemoglobin: 12.7 g/dL (ref 12.0–15.0)
MCH: 28.1 pg (ref 26.0–34.0)
MCHC: 32.2 g/dL (ref 30.0–36.0)
MCV: 87.4 fL (ref 80.0–100.0)
NRBC: 0 % (ref 0.0–0.2)
PLATELETS: 349 10*3/uL (ref 150–400)
RBC: 4.52 MIL/uL (ref 3.87–5.11)
RDW: 14.4 % (ref 11.5–15.5)
WBC: 10.4 10*3/uL (ref 4.0–10.5)

## 2018-08-11 LAB — URINALYSIS, ROUTINE W REFLEX MICROSCOPIC
Bacteria, UA: NONE SEEN
Bilirubin Urine: NEGATIVE
GLUCOSE, UA: NEGATIVE mg/dL
Ketones, ur: NEGATIVE mg/dL
Leukocytes, UA: NEGATIVE
Nitrite: NEGATIVE
PH: 7 (ref 5.0–8.0)
Protein, ur: NEGATIVE mg/dL
SPECIFIC GRAVITY, URINE: 1.017 (ref 1.005–1.030)

## 2018-08-11 LAB — COMPREHENSIVE METABOLIC PANEL
ALT: 18 U/L (ref 0–44)
AST: 25 U/L (ref 15–41)
Albumin: 4.2 g/dL (ref 3.5–5.0)
Alkaline Phosphatase: 42 U/L (ref 38–126)
Anion gap: 11 (ref 5–15)
BILIRUBIN TOTAL: 0.6 mg/dL (ref 0.3–1.2)
BUN: 7 mg/dL (ref 6–20)
CHLORIDE: 107 mmol/L (ref 98–111)
CO2: 21 mmol/L — ABNORMAL LOW (ref 22–32)
Calcium: 9.1 mg/dL (ref 8.9–10.3)
Creatinine, Ser: 0.84 mg/dL (ref 0.44–1.00)
GFR calc Af Amer: >60 mL/min (ref >60)
Glucose, Bld: 151 mg/dL — ABNORMAL HIGH (ref 70–99)
POTASSIUM: 3.3 mmol/L — AB (ref 3.5–5.1)
Sodium: 139 mmol/L (ref 135–145)
TOTAL PROTEIN: 7.6 g/dL (ref 6.5–8.1)

## 2018-08-11 LAB — PREGNANCY, URINE: Preg Test, Ur: NEGATIVE

## 2018-08-11 LAB — LIPASE, BLOOD: LIPASE: 21 U/L (ref 11–51)

## 2018-08-11 MED ORDER — SODIUM CHLORIDE 0.9 % IV BOLUS: 1000.0000 mL | Freq: Once | INTRAVENOUS | Status: DC

## 2018-08-11 MED ORDER — FAMOTIDINE IN NACL 20-0.9 MG/50ML-% IV SOLN
20.0000 mg | Freq: Once | INTRAVENOUS | Status: AC
  Administered 2018-08-11: 20 mg via INTRAVENOUS
  Filled 2018-08-11: qty 50

## 2018-08-11 MED ORDER — HYDROMORPHONE HCL 1 MG/ML IJ SOLN
0.5000 mg | Freq: Once | INTRAMUSCULAR | Status: AC
  Administered 2018-08-11: 0.5 mg via INTRAVENOUS
  Filled 2018-08-11: qty 1

## 2018-08-11 MED ORDER — METOCLOPRAMIDE HCL 5 MG/ML IJ SOLN
10.0000 mg | Freq: Once | INTRAMUSCULAR | Status: AC
  Administered 2018-08-11: 10 mg via INTRAVENOUS
  Filled 2018-08-11: qty 2

## 2018-08-11 MED ORDER — ONDANSETRON 8 MG PO TBDP: 8.0000 mg | ORAL_TABLET | Freq: Three times a day (TID) | ORAL | 0 refills | Status: DC | PRN

## 2018-08-11 MED ORDER — ONDANSETRON HCL 4 MG/2ML IJ SOLN
4.0000 mg | Freq: Once | INTRAMUSCULAR | Status: AC
  Administered 2018-08-11: 4 mg via INTRAVENOUS
  Filled 2018-08-11: qty 2

## 2018-08-11 MED ORDER — SODIUM CHLORIDE 0.9 % IV BOLUS
1000.0000 mL | Freq: Once | INTRAVENOUS | Status: AC
  Administered 2018-08-11: 1000 mL via INTRAVENOUS

## 2018-08-11 NOTE — Discharge Instructions (Addendum)
It was our pleasure to provide your ER care today - we hope that you feel better.  Rest. Drink plenty of fluids.   Take zofran as need for nausea. Note that marijuana use can cause a recurrent vomiting and upper abdominal pain syndrome - if recurrent similar symptoms, avoid marijuana use.   Take pepcid (acid blocker), and maalox as need for symptom relief.  Follow up with primary care doctor in 1-2 weeks.   Return to ER if worse, new symptoms, fevers, new or severe pain, persistent vomiting, other concern.  From todays lab tests, your potassium level is slightly low (3.3) - eat plenty of fruits and vegetables.

## 2018-08-11 NOTE — ED Notes (Signed)
Patient given ginger ale. 

## 2018-08-11 NOTE — ED Notes (Signed)
Pt stable and ambulatory for discharge, states understanding follow up.  

## 2018-08-11 NOTE — ED Notes (Signed)
Pt in restroom giving urine specimen 

## 2018-08-11 NOTE — ED Triage Notes (Signed)
Pt arrives pov with reports of drinking too much last night. Reports abd pain and emesis. Pt in NAD.

## 2018-08-11 NOTE — ED Provider Notes (Signed)
[facility] Provider Note   CSN: [id number] Arrival date & time: 08/11/18  1230     History   Chief Complaint Chief Complaint  Patient presents with  . Abdominal Pain    HPI Kelli Parks is a 27 y.o. female.  Patient c/o epigastric pain, onset last pm/early AM - pt indicates she drank too much etoh last night, and then symptoms started. Pain constant, dull, moderate, non radiating. Patient notes hx gastritis and gerd. No hx pancreatitis or gallstones. No fever or chills. Denies diarrhea. +vomiting - several times, not bloody or bilious. Denies chest pain or discomfort. No sob. No cough or uri symptoms. No headache.   The history is provided by the patient.  Abdominal Pain   Associated symptoms include nausea and vomiting. Pertinent negatives include fever, dysuria and headaches.    Past Medical History:  Diagnosis Date  . Allergy   . Gastritis     There are no active problems to display for this patient.   Past Surgical History:  Procedure Laterality Date  . NO PAST SURGERIES       OB History    Gravida  0   Para  0   Term  0   Preterm  0   AB  0   Living  0     SAB  0   TAB  0   Ectopic  0   Multiple  0   Live Births               Home Medications    Prior to Admission medications   Medication Sig Start Date End Date Taking? Authorizing Provider  cetirizine (ZYRTEC) 5 MG tablet Take 1 tablet (5 mg total) by mouth daily. 05/07/18   Dahlia ByesBast, Traci A, NP  ibuprofen (ADVIL,MOTRIN) 200 MG tablet Take 400 mg by mouth every 6 (six) hours as needed for headache.    [provider]    Family History Family History  Problem Relation Age of Onset  . Hypertension Mother   . Diabetes Maternal Grandmother   . Hypertension Maternal Grandmother     Social History Social History   Tobacco Use  . Smoking status: Current Some Day Smoker    Packs/day: 0.10    Types: Cigarettes  . Smokeless  tobacco: Never Used  . Tobacco comment: Smokes Black and Milds  Substance Use Topics  . Alcohol use: Yes  . Drug use: Yes    Types: Marijuana     Allergies   Patient has no known allergies.   Review of Systems Review of Systems  Constitutional: Negative for fever.  HENT: Negative for sore throat.   Eyes: Negative for redness.  Respiratory: Negative for cough and shortness of breath.   Cardiovascular: Negative for chest pain.  Gastrointestinal: Positive for abdominal pain, nausea and vomiting.  Genitourinary: Negative for dysuria, flank pain, vaginal bleeding and vaginal discharge.  Musculoskeletal: Negative for back pain and neck pain.  Skin: Negative for rash.  Neurological: Negative for headaches.  Hematological: Does not bruise/bleed easily.  Psychiatric/Behavioral: Negative for confusion.     Physical Exam Updated Vital Signs BP 129/71 (BP Location: Right Arm)   Pulse (!) 57   Temp 97.7 F (36.5 C) (Oral)   Resp 18   Ht 1.6 m (5\' 3" )   Wt 71.7 kg   SpO2 100%   BMI 27.99 kg/m   Physical Exam Vitals signs and nursing note reviewed.  Constitutional:  Appearance: Normal appearance. She is well-developed.  HENT:     Head: Atraumatic.     Nose: Nose normal.     Mouth/Throat:     Mouth: Mucous membranes are moist.     Pharynx: Oropharynx is clear.  Eyes:     General: No scleral icterus.    Conjunctiva/sclera: Conjunctivae normal.  Neck:     Musculoskeletal: Normal range of motion and neck supple. No neck rigidity.     Trachea: No tracheal deviation.  Cardiovascular:     Rate and Rhythm: Normal rate and regular rhythm.     Pulses: Normal pulses.     Heart sounds: Normal heart sounds. No murmur. No friction rub. No gallop.   Pulmonary:     Effort: Pulmonary effort is normal. No respiratory distress.     Breath sounds: Normal breath sounds.  Abdominal:     General: Abdomen is flat. Bowel sounds are normal. There is no distension.     Palpations:  Abdomen is soft. There is no mass.     Tenderness: There is abdominal tenderness. There is no guarding or rebound.     Hernia: No hernia is present.     Comments: Epigastric tenderness.   Genitourinary:    Comments: No cva tenderness.  Musculoskeletal:        General: No swelling or tenderness.  Skin:    General: Skin is warm and dry.     Findings: No rash.  Neurological:     Mental Status: She is alert.     Comments: Speech clear/fluent. Steady gait.   Psychiatric:        Mood and Affect: Mood normal.      ED Treatments / Results  Labs (all labs ordered are listed, but only abnormal results are displayed) Results for orders placed or performed during the hospital encounter of 08/11/18  CBC  Result Value Ref Range   WBC 10.4 4.0 - 10.5 K/uL   RBC 4.52 3.87 - 5.11 MIL/uL   Hemoglobin 12.7 12.0 - 15.0 g/dL   HCT 45.439.5 09.836.0 - 11.946.0 %   MCV 87.4 80.0 - 100.0 fL   MCH 28.1 26.0 - 34.0 pg   MCHC 32.2 30.0 - 36.0 g/dL   RDW 14.714.4 82.911.5 - 56.215.5 %   Platelets 349 150 - 400 K/uL   nRBC 0.0 0.0 - 0.2 %  Comprehensive metabolic panel  Result Value Ref Range   Sodium 139 135 - 145 mmol/L   Potassium 3.3 (L) 3.5 - 5.1 mmol/L   Chloride 107 98 - 111 mmol/L   CO2 21 (L) 22 - 32 mmol/L   Glucose, Bld 151 (H) 70 - 99 mg/dL   BUN 7 6 - 20 mg/dL   Creatinine, Ser 1.300.84 0.44 - 1.00 mg/dL   Calcium 9.1 8.9 - 86.510.3 mg/dL   Total Protein 7.6 6.5 - 8.1 g/dL   Albumin 4.2 3.5 - 5.0 g/dL   AST 25 15 - 41 U/L   ALT 18 0 - 44 U/L   Alkaline Phosphatase 42 38 - 126 U/L   Total Bilirubin 0.6 0.3 - 1.2 mg/dL   GFR calc non Af Amer >60 >60 mL/min   GFR calc Af Amer >60 >60 mL/min   Anion gap 11 5 - 15  Lipase, blood  Result Value Ref Range   Lipase 21 11 - 51 U/L  Pregnancy, urine  Result Value Ref Range   Preg Test, Ur NEGATIVE NEGATIVE  Urinalysis, Routine w reflex microscopic  Result  Value Ref Range   Color, Urine YELLOW YELLOW   APPearance CLEAR CLEAR   Specific Gravity, Urine 1.017  1.005 - 1.030   pH 7.0 5.0 - 8.0   Glucose, UA NEGATIVE NEGATIVE mg/dL   Hgb urine dipstick SMALL (A) NEGATIVE   Bilirubin Urine NEGATIVE NEGATIVE   Ketones, ur NEGATIVE NEGATIVE mg/dL   Protein, ur NEGATIVE NEGATIVE mg/dL   Nitrite NEGATIVE NEGATIVE   Leukocytes, UA NEGATIVE NEGATIVE   RBC / HPF 0-5 0 - 5 RBC/hpf   WBC, UA 0-5 0 - 5 WBC/hpf   Bacteria, UA NONE SEEN NONE SEEN   Squamous Epithelial / LPF 0-5 0 - 5   Mucus PRESENT   Rapid urine drug screen (hospital performed)  Result Value Ref Range   Opiates NONE DETECTED NONE DETECTED   Cocaine POSITIVE (A) NONE DETECTED   Benzodiazepines NONE DETECTED NONE DETECTED   Amphetamines NONE DETECTED NONE DETECTED   Tetrahydrocannabinol POSITIVE (A) NONE DETECTED   Barbiturates NONE DETECTED NONE DETECTED    EKG None  Radiology No results found.  Procedures Procedures (including critical care time)  Medications Ordered in ED Medications  sodium chloride 0.9 % bolus 1,000 mL (has no administration in time range)  famotidine (PEPCID) IVPB 20 mg premix (has no administration in time range)  ondansetron (ZOFRAN) injection 4 mg (has no administration in time range)  HYDROmorphone (DILAUDID) injection 0.5 mg (has no administration in time range)     Initial Impression / Assessment and Plan / ED Course  I have reviewed the triage vital signs and the nursing notes.  Pertinent labs & imaging results that were available during my care of the patient were reviewed by me and considered in my medical decision making (see chart for details).  Iv ns bolus. Dilaudid .5 mg iv. zofran iv. pepcid iv.   Labs sent.  Reviewed nursing notes and prior charts for additional history.   Labs reviewed - k sl low. ua neg.   Mild nausea persists. reglan iv.  Trial po fluids.  Pt is tolerating po, and requests additional ginger ale.   abd is soft nt. Pt afebrile.   Pt currently appears stable for d/c.     Final Clinical Impressions(s)  / ED Diagnoses   Final diagnoses:  None    ED Discharge Orders    None       Cathren Laine, MD 08/11/18 1658

## 2018-08-27 ENCOUNTER — Other Ambulatory Visit (HOSPITAL_COMMUNITY)
Admission: RE | Admit: 2018-08-27 | Discharge: 2018-08-27 | Disposition: A | Discharge status: Home / Self Care | Payer: Self-pay | Source: Ambulatory Visit | Attending: Internal Medicine | Admitting: Internal Medicine

## 2018-08-27 ENCOUNTER — Encounter: Payer: Self-pay | Admitting: Internal Medicine

## 2018-08-27 ENCOUNTER — Other Ambulatory Visit: Payer: Self-pay

## 2018-08-27 ENCOUNTER — Ambulatory Visit: Payer: Self-pay | Admitting: Internal Medicine

## 2018-08-27 VITALS — BP 109/94 | HR 62 | Temp 97.5°F | Ht 63.0 in | Wt 148.0 lb

## 2018-08-27 DIAGNOSIS — Z7289 Other problems related to lifestyle: Secondary | ICD-10-CM

## 2018-08-27 DIAGNOSIS — F1090 Alcohol use, unspecified, uncomplicated: Secondary | ICD-10-CM | POA: Insufficient documentation

## 2018-08-27 DIAGNOSIS — N939 Abnormal uterine and vaginal bleeding, unspecified: Secondary | ICD-10-CM | POA: Insufficient documentation

## 2018-08-27 DIAGNOSIS — N760 Acute vaginitis: Secondary | ICD-10-CM

## 2018-08-27 DIAGNOSIS — Z720 Tobacco use: Secondary | ICD-10-CM

## 2018-08-27 DIAGNOSIS — Z789 Other specified health status: Secondary | ICD-10-CM | POA: Insufficient documentation

## 2018-08-27 DIAGNOSIS — N921 Excessive and frequent menstruation with irregular cycle: Secondary | ICD-10-CM

## 2018-08-27 NOTE — Assessment & Plan Note (Signed)
Patient with at risk drinking behavior; she denies prior DUIs, loss of employment or other major impact on her life due to drinking. She is considering cutting back.  We discussed that her current drinking habits are high risk and encouraged her to decrease her intake.   Her PHQ9 score was 11 today; patient states she has only had the symptoms in the last 1-2 wks since her bleeding restarted.   F/u in 1 month

## 2018-08-27 NOTE — Assessment & Plan Note (Addendum)
Patient with h/o irregular menstrual bleeding now with 2 months of menorrhagia. Hgb and plts normal on recent labs. Exam benign.  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  --pap not done as patient actively having menstrual bleeding which may cause inaccurate results; if pap not done with gynecology will plan on obtaining at f/u --from chart review, it appears she only received the first in the HPV quadrivalent series; needs 2 more in the future  Addendum: Labs negative except for wet prep + for bacterial vaginosis. Though patient is asymptomatic, she has high risk sexual practices and infection with bacterial vaginosis might increase risk of STIs, will go ahead and treat with flagyl 500mg  BID x7d.

## 2018-08-27 NOTE — Patient Instructions (Addendum)
I'll call you with your test results; it may take a couple of days to get them all back.  We have placed a referral to [facility] at [facility]. You should be hearing about an appointment in the next week or so; if you don't get a call, please call our clinic or their clinic directly [phone]). If your bleeding becomes excessive, they also have a walk in clinic you can utilize.

## 2018-08-27 NOTE — Progress Notes (Signed)
   CC: abnormal vaginal bleeding  HPI:  [name] is a 28 y.o. without a significant medical history presenting with abnormal vaginal bleeding.  Patient endorses 2 month h/o prolonged and heavy menstrual bleeding; she had an 11d period of bleeding at end of Nov, end of Dec. Five days after cessation of bleeding in Dec, vaginal bleeding resumed again.  Patient endorses h/o irregular periods for years, with periods lasting 7d consistently, using ~3-4 pads and tampons per day during first couple of days. She did have a 75yr period where her periods had regulated however this stopped about 1 yr ago.   Patient denies other sites of bleeding including hematuria, gum bleeding, epistaxis, hematochezia. She reports her mom had a hysterectomy in the last couple of years possibly for fibroids but she isn't certain.  She denies nausea, vomiting, abd pain, chest pain, shortness of breath, dysuria, other vaginal discharge.  She is sexually active and has had 2 different partners in the last year; she endorses getting treated for trich in the last year. She inconsistently uses condoms, and has never been on OCPs or had an IUD/Nexplenon. She denies prior pregnancies or miscarriages.   On chart review, she had recent ED visit for vomiting; labs at the time revealed Hgb and platelets within normal range.  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  Past Medical History:  Diagnosis Date  . Allergy   . Gastritis     Review of Systems:   Per  HPI  Physical Exam:  Vitals:   08/27/18 0920  BP: (!) 109/94  Pulse: 62  Temp: (!) 97.5 F (36.4 C)  TempSrc: Oral  SpO2: 100%  Weight: 148 lb (67.1 kg)  Height: 5\' 3"  (1.6 m)   GENERAL- alert, co-operative, appears as stated age, not in any distress. HEENT- oral mucosa appears moist. CARDIAC- RRR, no murmurs, rubs or gallops. RESP- Moving equal volumes of air, and clear to auscultation bilaterally, no wheezes or crackles. ABDOMEN- Soft, nontender, bowel sounds present, no masses or organomegaly noted. GYN- no external or internal genital lesions; bleeding noted at cervical os. PSYCH- Normal mood and affect, appropriate thought content and speech.  Assessment & Plan:   See Encounters Tab for problem based charting.   Patient discussed with Dr. Fredrich Romans, MD Internal Medicine PGY-3

## 2018-08-28 LAB — CERVICOVAGINAL ANCILLARY ONLY
Bacterial vaginitis: POSITIVE — AB
CANDIDA VAGINITIS: NEGATIVE
CHLAMYDIA, DNA PROBE: NEGATIVE
Neisseria Gonorrhea: NEGATIVE
Trichomonas: NEGATIVE

## 2018-08-28 LAB — HEPATITIS C ANTIBODY: Hep C Virus Ab: <0.1 s/co ratio (ref 0.0–0.9)

## 2018-08-28 LAB — HIV ANTIBODY (ROUTINE TESTING W REFLEX): HIV Screen 4th Generation wRfx: NONREACTIVE

## 2018-08-28 LAB — HCG, SERUM, QUALITATIVE: hCG,Beta Subunit,Qual,Serum: NEGATIVE m[IU]/mL (ref <6)

## 2018-08-28 LAB — RPR: RPR: NONREACTIVE

## 2018-08-28 LAB — TSH: TSH: 1.13 u[IU]/mL (ref 0.450–4.500)

## 2018-08-31 MED ORDER — METRONIDAZOLE 500 MG PO TABS: 500.0000 mg | ORAL_TABLET | Freq: Two times a day (BID) | ORAL | 0 refills | Status: AC

## 2018-08-31 NOTE — Addendum Note (Signed)
Addended by: Nyra Market on: 08/31/2018 08:48 AM   Modules accepted: Orders

## 2018-08-31 NOTE — Progress Notes (Signed)
Internal Medicine Clinic Attending  Case discussed with Dr. Svalina  at the time of the visit.  We reviewed the resident's history and exam and pertinent patient test results.  I agree with the assessment, diagnosis, and plan of care documented in the resident's note.  

## 2018-09-16 ENCOUNTER — Ambulatory Visit: Payer: Self-pay

## 2018-09-16 ENCOUNTER — Encounter: Payer: Self-pay | Admitting: Internal Medicine

## 2018-09-25 ENCOUNTER — Other Ambulatory Visit: Payer: Self-pay

## 2018-09-25 ENCOUNTER — Encounter: Payer: Self-pay | Admitting: Internal Medicine

## 2018-09-25 ENCOUNTER — Ambulatory Visit (INDEPENDENT_AMBULATORY_CARE_PROVIDER_SITE_OTHER): Payer: Self-pay | Admitting: Internal Medicine

## 2018-09-25 VITALS — BP 124/67 | HR 64 | Temp 98.9°F | Ht 63.0 in | Wt 155.6 lb

## 2018-09-25 DIAGNOSIS — N939 Abnormal uterine and vaginal bleeding, unspecified: Secondary | ICD-10-CM

## 2018-09-25 NOTE — Patient Instructions (Signed)
MS. Laural Benes,   You have an appointment with OB/GYN for evaluation of your heavy bleeding. Please remind them you have not had a PAP smear and ask them to send their records to the Internal Medicine Clinic at [facility].   I will see you back in 1 month to make sure everything is going well and that there is nothing else that we need to address.   Please call us if you have any questions or concerns.   - Dr. Evelene Croon

## 2018-09-25 NOTE — Progress Notes (Signed)
   CC: Abnormal vaginal bleeding   HPI:  [name] is a 28 y.o. year-old female with PMH listed below who presents to clinic for abnormal vaginal bleeding follow up. Please see problem based assessment and plan for further details.   Past Medical History:  Diagnosis Date  . Allergy   . Gastritis    Review of Systems:   Review of Systems  Constitutional: Negative for chills, fever and malaise/fatigue.  Respiratory: Negative for cough and shortness of breath.   Cardiovascular: Negative for chest pain and palpitations.  Neurological: Negative for dizziness, weakness and headaches.    Physical Exam: Vitals:   09/25/18 1400  BP: 124/67  Pulse: 64  Temp: 98.9 F (37.2 C)  TempSrc: Oral  SpO2: 100%  Weight: 155 lb 9.6 oz (70.6 kg)  Height: 5\' 3"  (1.6 m)    General: well-appearing young female in NAD  Eyes: no conjunctival pallor noted  Cardiac: regular rate and rhythm, nl S1/S2, no murmurs, rubs or gallops Pulm: CTAB, no wheezes or crackles, no increased work of breathing on room air  Ext: warm and well perfused, no peripheral edema     Office Visit from 09/25/2018 in [facility]  PHQ-9 Total Score  3       Assessment & Plan:   See Encounters Tab for problem based charting.  Patient discussed with Dr. Heide Spark

## 2018-09-25 NOTE — Assessment & Plan Note (Addendum)
Kelli Parks presents for follow up of abnormal uterine bleeding. She has had 2 more episodes of bleeding since she was last seen on 08/31/2018  that lasted 5-7 days. She reports these felt like a normal period for her. On the heavy days, she has to change her tampons 3-4x per day. She has also noticed clots. She was referred to OB/GYN 2-3 weeks ago but has not been able to see them yet. She denies fatigue, palpitations, tachycardia, and shortness of breath. No signs of anemia on exam.   She is sexually active with one female partner at this time and reports consistent condom use. Of note, she has been treated for trichomonas in the past. Vaginal swab during last visit showed bacterial vaginosis. She was asymptomatic but reports completing a course of Flagyl.   - Appt with OB/GYN scheduled for 2/27 for evaluation of menorrhagia/AUB  - PAP smear not performed durign this visit as she has GYN appt in 2 weeks  - Did not check CBC given no signs/symptoms of anemia during today's visit

## 2018-09-28 NOTE — Progress Notes (Signed)
Internal Medicine Clinic Attending  Case discussed with Dr. Prince at the time of the visit.  We reviewed the resident's history and exam and pertinent patient test results.  I agree with the assessment, diagnosis, and plan of care documented in the resident's note.   

## 2018-09-30 ENCOUNTER — Ambulatory Visit: Payer: Self-pay

## 2018-10-13 ENCOUNTER — Encounter: Payer: Self-pay | Admitting: Internal Medicine

## 2018-10-15 ENCOUNTER — Encounter: Payer: Self-pay | Admitting: Obstetrics & Gynecology

## 2018-10-21 NOTE — Addendum Note (Signed)
Addended by: Neomia Dear on: 10/21/2018 02:27 PM   Modules accepted: Orders

## 2019-02-08 ENCOUNTER — Emergency Department (HOSPITAL_COMMUNITY)
Admission: EM | Admit: 2019-02-08 | Discharge: 2019-02-08 | Disposition: A | Discharge status: Home / Self Care | Payer: Self-pay | Attending: Emergency Medicine | Admitting: Emergency Medicine

## 2019-02-08 ENCOUNTER — Other Ambulatory Visit: Payer: Self-pay

## 2019-02-08 DIAGNOSIS — K2900 Acute gastritis without bleeding: Secondary | ICD-10-CM | POA: Insufficient documentation

## 2019-02-08 DIAGNOSIS — F17210 Nicotine dependence, cigarettes, uncomplicated: Secondary | ICD-10-CM | POA: Insufficient documentation

## 2019-02-08 LAB — CBC
HCT: 37.3 % (ref 36.0–46.0)
Hemoglobin: 12.2 g/dL (ref 12.0–15.0)
MCH: 29 pg (ref 26.0–34.0)
MCHC: 32.7 g/dL (ref 30.0–36.0)
MCV: 88.6 fL (ref 80.0–100.0)
Platelets: 268 10*3/uL (ref 150–400)
RBC: 4.21 MIL/uL (ref 3.87–5.11)
RDW: 14.6 % (ref 11.5–15.5)
WBC: 8.7 10*3/uL (ref 4.0–10.5)
nRBC: 0 % (ref 0.0–0.2)

## 2019-02-08 LAB — COMPREHENSIVE METABOLIC PANEL
ALT: 51 U/L — ABNORMAL HIGH (ref 0–44)
AST: 56 U/L — ABNORMAL HIGH (ref 15–41)
Albumin: 4.4 g/dL (ref 3.5–5.0)
Alkaline Phosphatase: 51 U/L (ref 38–126)
Anion gap: 13 (ref 5–15)
BUN: 10 mg/dL (ref 6–20)
CO2: 21 mmol/L — ABNORMAL LOW (ref 22–32)
Calcium: 9.5 mg/dL (ref 8.9–10.3)
Chloride: 104 mmol/L (ref 98–111)
Creatinine, Ser: 0.76 mg/dL (ref 0.44–1.00)
GFR calc Af Amer: >60 mL/min (ref >60)
GFR calc non Af Amer: >60 mL/min (ref >60)
Glucose, Bld: 125 mg/dL — ABNORMAL HIGH (ref 70–99)
Potassium: 3.9 mmol/L (ref 3.5–5.1)
Sodium: 138 mmol/L (ref 135–145)
Total Bilirubin: 0.5 mg/dL (ref 0.3–1.2)
Total Protein: 7.9 g/dL (ref 6.5–8.1)

## 2019-02-08 LAB — URINALYSIS, ROUTINE W REFLEX MICROSCOPIC
Bilirubin Urine: NEGATIVE
Glucose, UA: NEGATIVE mg/dL
Hgb urine dipstick: NEGATIVE
Ketones, ur: 80 mg/dL — AB
Nitrite: NEGATIVE
Protein, ur: 30 mg/dL — AB
Specific Gravity, Urine: 1.021 (ref 1.005–1.030)
pH: 8 (ref 5.0–8.0)

## 2019-02-08 LAB — LIPASE, BLOOD: Lipase: 21 U/L (ref 11–51)

## 2019-02-08 MED ORDER — ONDANSETRON HCL 4 MG/2ML IJ SOLN: 4.0000 mg | Freq: Once | INTRAMUSCULAR | Status: DC

## 2019-02-08 MED ORDER — SUCRALFATE 1 G PO TABS: 1.0000 g | ORAL_TABLET | Freq: Three times a day (TID) | ORAL | 0 refills | Status: DC

## 2019-02-08 MED ORDER — PROMETHAZINE HCL 25 MG/ML IJ SOLN
25.0000 mg | Freq: Once | INTRAMUSCULAR | Status: AC
  Administered 2019-02-08: 25 mg via INTRAVENOUS
  Filled 2019-02-08: qty 1

## 2019-02-08 MED ORDER — MORPHINE SULFATE (PF) 4 MG/ML IV SOLN
4.0000 mg | Freq: Once | INTRAVENOUS | Status: AC
  Administered 2019-02-08: 4 mg via INTRAVENOUS
  Filled 2019-02-08: qty 1

## 2019-02-08 MED ORDER — SODIUM CHLORIDE 0.9 % IV BOLUS
1000.0000 mL | Freq: Once | INTRAVENOUS | Status: AC
  Administered 2019-02-08: 1000 mL via INTRAVENOUS

## 2019-02-08 MED ORDER — SODIUM CHLORIDE 0.9 % IV BOLUS
1000.0000 mL | Freq: Once | INTRAVENOUS | Status: AC
  Administered 2019-02-08: 10:00:00 1000 mL via INTRAVENOUS

## 2019-02-08 MED ORDER — PROMETHAZINE HCL 25 MG PO TABS: 25.0000 mg | ORAL_TABLET | Freq: Four times a day (QID) | ORAL | 0 refills | Status: DC | PRN

## 2019-02-08 NOTE — ED Notes (Signed)
Pt tolerating fluids well. 

## 2019-02-08 NOTE — ED Provider Notes (Signed)
[facility]   CSN: [id number] Arrival date & time: 02/08/19  0458     History   Chief Complaint Chief Complaint  Patient presents with  . Abdominal Pain    HPI Kelli Parks is a 28 y.o. female.     HPI Patient presents to the emergency department with nausea vomiting with abdominal discomfort.  The patient states she drank a fair amount of alcohol yesterday and then the nausea vomiting started with abdominal discomfort.  The patient states that she has had a history of gastritis in the past.  Patient states that she did not take any medications prior to arrival for symptoms.  The patient denies chest pain, shortness of breath, headache,blurred vision, neck pain, fever, cough, weakness, numbness, dizziness, anorexia, edema,  diarrhea, rash, back pain, dysuria, hematemesis, bloody stool, near syncope, or syncope. Past Medical History:  Diagnosis Date  . Allergy   . Gastritis     Patient Active Problem List   Diagnosis Date Noted  . Abnormal uterine bleeding 08/27/2018  . Alcohol use 08/27/2018    Past Surgical History:  Procedure Laterality Date  . NO PAST SURGERIES       OB History    Gravida  0   Para  0   Term  0   Preterm  0   AB  0   Living  0     SAB  0   TAB  0   Ectopic  0   Multiple  0   Live Births               Home Medications    Prior to Admission medications   Not on File    Family History Family History  Problem Relation Age of Onset  . Hypertension Mother   . Diabetes Maternal Grandmother   . Hypertension Maternal Grandmother     Social History Social History   Tobacco Use  . Smoking status: Current Some Day Smoker    Packs/day: 0.10    Types: Cigarettes  . Smokeless tobacco: Never Used  . Tobacco comment: Smokes Black and Milds  Substance Use Topics  . Alcohol use: Yes    Comment: 0.5 pint of cognac every other day  . Drug use: Yes    Types: Marijuana     Comment: tried cocaine twice     Allergies   Patient has no known allergies.   Review of Systems Review of Systems All other systems negative except as documented in the HPI. All pertinent positives and negatives as reviewed in the HPI.  Physical Exam Updated Vital Signs BP 130/75   Pulse (!) 59   Temp 97.6 F (36.4 C) (Oral)   Resp 19   Ht 5\' 3"  (1.6 m)   Wt 70.3 kg   LMP 02/04/2019 (Approximate)   SpO2 100%   BMI 27.46 kg/m   Physical Exam Vitals signs and nursing note reviewed.  Constitutional:      General: She is not in acute distress.    Appearance: She is well-developed.  HENT:     Head: Normocephalic and atraumatic.  Eyes:     Pupils: Pupils are equal, round, and reactive to light.  Neck:     Musculoskeletal: Normal range of motion and neck supple.  Cardiovascular:     Rate and Rhythm: Normal rate and regular rhythm.     Heart sounds: Normal heart sounds. No murmur. No friction rub. No gallop.  Pulmonary:     Effort: Pulmonary effort is normal. No respiratory distress.     Breath sounds: Normal breath sounds. No wheezing.  Abdominal:     General: Bowel sounds are normal. There is no distension.     Palpations: Abdomen is soft.     Tenderness: There is generalized abdominal tenderness and tenderness in the epigastric area.  Skin:    General: Skin is warm and dry.     Capillary Refill: Capillary refill takes less than 2 seconds.     Findings: No erythema or rash.  Neurological:     Mental Status: She is alert and oriented to person, place, and time.     Motor: No abnormal muscle tone.     Coordination: Coordination normal.  Psychiatric:        Behavior: Behavior normal.      ED Treatments / Results  Labs (all labs ordered are listed, but only abnormal results are displayed) Labs Reviewed  COMPREHENSIVE METABOLIC PANEL - Abnormal; Notable for the following components:      Result Value   CO2 21 (*)    Glucose, Bld 125 (*)    AST 56 (*)     ALT 51 (*)    All other components within normal limits  URINALYSIS, ROUTINE W REFLEX MICROSCOPIC - Abnormal; Notable for the following components:   Ketones, ur 80 (*)    Protein, ur 30 (*)    Leukocytes,Ua TRACE (*)    Bacteria, UA RARE (*)    All other components within normal limits  LIPASE, BLOOD  CBC  I-STAT BETA HCG BLOOD, ED ([facility], [facility], [facility] ONLY)    EKG    Radiology No results found.  Procedures Procedures (including critical care time)  Medications Ordered in ED Medications  morphine 4 MG/ML injection 4 mg (has no administration in time range)  sodium chloride 0.9 % bolus 1,000 mL (0 mLs Intravenous Stopped 02/08/19 0942)  promethazine (PHENERGAN) injection 25 mg (25 mg Intravenous Given 02/08/19 0737)  sodium chloride 0.9 % bolus 1,000 mL (1,000 mLs Intravenous New Bag/Given 02/08/19 0943)     Initial Impression / Assessment and Plan / ED Course  I have reviewed the triage vital signs and the nursing notes.  Pertinent labs & imaging results that were available during my care of the patient were reviewed by me and considered in my medical decision making (see chart for details).       Patient will be given IV fluids along with antiemetics.  This appears to be mostly alcohol induced GI irritation with nausea and vomiting.  The patient will be given IV fluids and anti-medics and reassess.  The patient laboratory testing does not show any significant abnormality other than some dehydration.  Patient will be given a p.o. fluid trial and reassess at that point.  Final Clinical Impressions(s) / ED Diagnoses   Final diagnoses:  None    ED Discharge Orders    None       Dalia Heading, PA-C 02/08/19 1030    Charlesetta Shanks, MD 02/08/19 1121

## 2019-02-08 NOTE — Discharge Instructions (Signed)
Your testing here today did not show significant abnormalities.  Your urine did show that you are dehydrated.  We did give you 2 L of IV fluid which will help replete your lost fluids.  You need to slowly increase your fluid intake.  Rest as much as possible.

## 2019-02-08 NOTE — ED Notes (Signed)
Pt tolerated oral fluids

## 2019-02-08 NOTE — ED Notes (Signed)
Patient was given water for PO challenge.

## 2019-02-08 NOTE — ED Notes (Signed)
Pt verbalized understanding of d/c instructions and has no further questions, VSS, NAD.  

## 2019-02-08 NOTE — ED Triage Notes (Addendum)
Pt c/o abd pain that started last night accompanied by N/V. Thinks it may be alcohol poisoning.

## 2019-06-05 ENCOUNTER — Other Ambulatory Visit: Payer: Self-pay

## 2019-06-05 ENCOUNTER — Encounter (HOSPITAL_COMMUNITY): Payer: Self-pay

## 2019-06-05 ENCOUNTER — Emergency Department (HOSPITAL_COMMUNITY)
Admission: EM | Admit: 2019-06-05 | Discharge: 2019-06-05 | Disposition: A | Discharge status: Home / Self Care | Payer: Self-pay | Attending: Emergency Medicine | Admitting: Emergency Medicine

## 2019-06-05 DIAGNOSIS — F17210 Nicotine dependence, cigarettes, uncomplicated: Secondary | ICD-10-CM | POA: Insufficient documentation

## 2019-06-05 DIAGNOSIS — Z113 Encounter for screening for infections with a predominantly sexual mode of transmission: Secondary | ICD-10-CM | POA: Insufficient documentation

## 2019-06-05 DIAGNOSIS — Z79899 Other long term (current) drug therapy: Secondary | ICD-10-CM | POA: Insufficient documentation

## 2019-06-05 LAB — RAPID HIV SCREEN (HIV 1/2 AB+AG)
HIV 1/2 Antibodies: NONREACTIVE
HIV-1 P24 Antigen - HIV24: NONREACTIVE

## 2019-06-05 NOTE — ED Notes (Signed)
Patient verbalizes understanding of discharge instructions. Opportunity for questioning and answers were provided. Armband removed by staff, pt discharged from ED.  

## 2019-06-05 NOTE — ED Provider Notes (Signed)
[facility] EMERGENCY DEPARTMENT Provider Note   CSN: [id number] Arrival date & time: 06/05/19  1811     History   Chief Complaint Chief Complaint  Patient presents with  . SEXUALLY TRANSMITTED DISEASE    HPI Kelli Parks is a 28 y.o. female with a past medical history of alcohol abuse presenting to the ED requesting HIV and herpes testing.  States that 2 weeks ago she went to a free clinic and had a complete STD panel check.  She got the results of her gonorrhea, chlamydia and trichomonas testing which were all negative.  She was tested for HSV and HIV but never received the results.  States that she has tried to follow-up with the clinic but they are not unable to tell her the results.  She does not have any vaginal discharge, open sores or blisters, known exposures to any STDs.     HPI  Past Medical History:  Diagnosis Date  . Allergy   . Gastritis     Patient Active Problem List   Diagnosis Date Noted  . Abnormal uterine bleeding 08/27/2018  . Alcohol use 08/27/2018    Past Surgical History:  Procedure Laterality Date  . NO PAST SURGERIES       OB History    Gravida  0   Para  0   Term  0   Preterm  0   AB  0   Living  0     SAB  0   TAB  0   Ectopic  0   Multiple  0   Live Births               Home Medications    Prior to Admission medications   Medication Sig Start Date End Date Taking? Authorizing Provider  promethazine (PHENERGAN) 25 MG tablet Take 1 tablet (25 mg total) by mouth every 6 (six) hours as needed for nausea or vomiting. 02/08/19   Lawyer, Cristal Deer, PA-C  sucralfate (CARAFATE) 1 g tablet Take 1 tablet (1 g total) by mouth 4 (four) times daily -  with meals and at bedtime. 02/08/19   Charlestine Night, PA-C    Family History Family History  Problem Relation Age of Onset  . Hypertension Mother   . Diabetes Maternal Grandmother   . Hypertension Maternal Grandmother     Social History  Social History   Tobacco Use  . Smoking status: Current Some Day Smoker    Packs/day: 0.10    Types: Cigarettes  . Smokeless tobacco: Never Used  . Tobacco comment: Smokes Black and Milds  Substance Use Topics  . Alcohol use: Yes    Comment: 0.5 pint of cognac every other day  . Drug use: Yes    Types: Marijuana    Comment: tried cocaine twice     Allergies   Patient has no known allergies.   Review of Systems Review of Systems  Constitutional: Negative for chills and fever.  Genitourinary: Negative for dysuria, genital sores and vaginal discharge.  Skin: Negative for wound.     Physical Exam Updated Vital Signs BP (!) 146/83   Pulse 80   Temp 98.4 F (36.9 C) (Oral)   Resp 16   Ht 5\' 3"  (1.6 m)   Wt 70.3 kg   LMP 05/30/2019   SpO2 100%   BMI 27.46 kg/m   Physical Exam Vitals signs and nursing note reviewed.  Constitutional:      General: She is not in  acute distress.    Appearance: She is well-developed. She is not diaphoretic.  HENT:     Head: Normocephalic and atraumatic.  Eyes:     General: No scleral icterus.    Conjunctiva/sclera: Conjunctivae normal.  Neck:     Musculoskeletal: Normal range of motion.  Pulmonary:     Effort: Pulmonary effort is normal. No respiratory distress.  Genitourinary:    Comments: Patient declined. Skin:    Findings: No rash.  Neurological:     Mental Status: She is alert.      ED Treatments / Results  Labs (all labs ordered are listed, but only abnormal results are displayed) Labs Reviewed  RAPID HIV SCREEN (HIV 1/2 AB+AG)    EKG None  Radiology No results found.  Procedures Procedures (including critical care time)  Medications Ordered in ED Medications - No data to display   Initial Impression / Assessment and Plan / ED Course  I have reviewed the triage vital signs and the nursing notes.  Pertinent labs & imaging results that were available during my care of the patient were reviewed by me  and considered in my medical decision making (see chart for details).        28 year old female presenting to the ED requesting HIV and HSV testing.  She tested negative for gonorrhea, chlamydia and trichomonas about 2 weeks ago but never got the results of her HIV or HSV test.  She denies any sores or pain in the vaginal area currently.  She declines subsequent gonorrhea, chlamydia or trichomonas testing.  Informed patient that unable to obtain HSV test she does not have any active symptoms.  Rapid HIV test was negative.  Patient will be discharged home with PCP follow-up. Return for worsening symptoms.  Patient is hemodynamically stable, in NAD, and able to ambulate in the ED. Evaluation does not show pathology that would require ongoing emergent intervention or inpatient treatment. I explained the diagnosis to the patient. Pain has been managed and has no complaints prior to discharge. Patient is comfortable with above plan and is stable for discharge at this time. All questions were answered prior to disposition. Strict return precautions for returning to the ED were discussed. Encouraged follow up with PCP.   An After Visit Summary was printed and given to the patient.   Portions of this note were generated with [employer]. Dictation errors may occur despite best attempts at proofreading.   Final Clinical Impressions(s) / ED Diagnoses   Final diagnoses:  Encounter for screening examination for sexually transmitted disease    ED Discharge Orders    None       Delia Heady, PA-C 06/05/19 2222    Virgel Manifold, MD 06/07/19 1524

## 2019-06-05 NOTE — Discharge Instructions (Signed)
Your testing today was negative. Please follow-up with your primary care provider. Return to the ED if you start to have worsening symptoms, develop severe abdominal pain with fever, chest pain, shortness of breath.

## 2019-06-05 NOTE — ED Triage Notes (Signed)
Pt reports she was seen at a free clinic a few weeks ago for an STD check. Pt tested negative for Gonorrhea, chlamydia and trich. Pt wants to be tested for Herpes and HIV. Denies any discharge or open sores at this time but did have sores around her vagina when she was first tested

## 2019-07-05 ENCOUNTER — Emergency Department (HOSPITAL_COMMUNITY): Admission: EM | Admit: 2019-07-05 | Payer: Self-pay

## 2019-07-05 NOTE — ED Notes (Signed)
Pt left AMA °

## 2019-09-03 ENCOUNTER — Other Ambulatory Visit: Payer: Self-pay

## 2019-09-03 ENCOUNTER — Ambulatory Visit (HOSPITAL_COMMUNITY)
Admission: EM | Admit: 2019-09-03 | Discharge: 2019-09-03 | Disposition: A | Discharge status: Home / Self Care | Payer: Self-pay | Attending: Family Medicine | Admitting: Family Medicine

## 2019-09-03 ENCOUNTER — Encounter (HOSPITAL_COMMUNITY): Payer: Self-pay

## 2019-09-03 DIAGNOSIS — R21 Rash and other nonspecific skin eruption: Secondary | ICD-10-CM | POA: Insufficient documentation

## 2019-09-03 NOTE — Discharge Instructions (Signed)
We will call you with any positive results.

## 2019-09-03 NOTE — ED Triage Notes (Signed)
Pt present vaginal irration, symptoms started back in Aug 2020. Pt state that she has open sores on her vaginal areas

## 2019-09-05 LAB — HSV CULTURE AND TYPING

## 2019-09-06 ENCOUNTER — Encounter (HOSPITAL_COMMUNITY): Payer: Self-pay

## 2019-09-06 ENCOUNTER — Telehealth (HOSPITAL_COMMUNITY): Payer: Self-pay | Admitting: Emergency Medicine

## 2019-09-06 NOTE — ED Provider Notes (Signed)
[facility]    CSN: [id number] Arrival date & time: 09/03/19  1453      History   Chief Complaint Chief Complaint  Patient presents with  . vaginal irration    HPI BRYTNEE BECHLER is a 29 y.o. female.   Pt is a 29 year old female that presents with rash to vaginal area. This has been present for the past few days. She has had a few episodes like this over the past 6 months. Tender to touch. No drainage. No vaginal discharge, itching or irritation. Has not put anything on the rash.  Denies any fever, joint pain. Denies any recent changes in lotions, detergents, foods or other possible irritants. No recent travel. Nobody else at home has the rash. Patient has been outside but denies any contact with plants or insects. No new foods or medications.   ROS per HPI     Past Medical History:  Diagnosis Date  . Allergy   . Gastritis     Patient Active Problem List   Diagnosis Date Noted  . Abnormal uterine bleeding 08/27/2018  . Alcohol use 08/27/2018    Past Surgical History:  Procedure Laterality Date  . NO PAST SURGERIES      OB History    Gravida  0   Para  0   Term  0   Preterm  0   AB  0   Living  0     SAB  0   TAB  0   Ectopic  0   Multiple  0   Live Births               Home Medications    Prior to Admission medications   Medication Sig Start Date End Date Taking? Authorizing Provider  promethazine (PHENERGAN) 25 MG tablet Take 1 tablet (25 mg total) by mouth every 6 (six) hours as needed for nausea or vomiting. 02/08/19   Lawyer, Cristal Deer, PA-C  sucralfate (CARAFATE) 1 g tablet Take 1 tablet (1 g total) by mouth 4 (four) times daily -  with meals and at bedtime. 02/08/19   Charlestine Night, PA-C    Family History Family History  Problem Relation Age of Onset  . Hypertension Mother   . Diabetes Maternal Grandmother   . Hypertension Maternal Grandmother     Social History Social History   Tobacco Use  .  Smoking status: Current Some Day Smoker    Packs/day: 0.10    Types: Cigarettes  . Smokeless tobacco: Never Used  . Tobacco comment: Smokes Black and Milds  Substance Use Topics  . Alcohol use: Yes    Comment: 0.5 pint of cognac every other day  . Drug use: Yes    Types: Marijuana    Comment: tried cocaine twice     Allergies   Patient has no known allergies.   Review of Systems Review of Systems   Physical Exam Triage Vital Signs ED Triage Vitals [09/03/19 1539]  Enc Vitals Group     BP 138/75     Pulse Rate 62     Resp 16     Temp 98.4 F (36.9 C)     Temp Source Oral     SpO2 100 %     Weight      Height      Head Circumference      Peak Flow      Pain Score 0     Pain Loc  Pain Edu?      Excl. in [place]?    No data found.  Updated Vital Signs BP 138/75 (BP Location: Right Arm)   Pulse 62   Temp 98.4 F (36.9 C) (Oral)   Resp 16   LMP 09/02/2019   SpO2 100%   Visual Acuity Right Eye Distance:   Left Eye Distance:   Bilateral Distance:    Right Eye Near:   Left Eye Near:    Bilateral Near:     Physical Exam Vitals and nursing note reviewed.  Constitutional:      General: She is not in acute distress.    Appearance: Normal appearance. She is not ill-appearing, toxic-appearing or diaphoretic.  HENT:     Head: Normocephalic.     Nose: Nose normal.     Mouth/Throat:     Pharynx: Oropharynx is clear.  Eyes:     Conjunctiva/sclera: Conjunctivae normal.  Pulmonary:     Effort: Pulmonary effort is normal.  Genitourinary:      Comments: 2 open sores.  Musculoskeletal:        General: Normal range of motion.     Cervical back: Normal range of motion.  Skin:    General: Skin is warm and dry.     Findings: No rash.  Neurological:     Mental Status: She is alert.  Psychiatric:        Mood and Affect: Mood normal.      UC Treatments / Results  Labs (all labs ordered are listed, but only abnormal results are displayed) Labs Reviewed    HSV CULTURE AND TYPING - Abnormal; Notable for the following components:      Result Value   HSV Culture/Type Comment (*)    All other components within normal limits    EKG   Radiology No results found.  Procedures Procedures (including critical care time)  Medications Ordered in UC Medications - No data to display  Initial Impression / Assessment and Plan / UC Course  I have reviewed the triage vital signs and the nursing notes.  Pertinent labs & imaging results that were available during my care of the patient were reviewed by me and considered in my medical decision making (see chart for details).     Rash- consistent with HSV Pt feels the rash is improving Opting not to treat today.  Sending swab for testing.  No known hx of HSV.   Final Clinical Impressions(s) / UC Diagnoses   Final diagnoses:  Rash and nonspecific skin eruption     Discharge Instructions     We will call you with any positive results.     ED Prescriptions    None     PDMP not reviewed this encounter.   Orvan July, NP 09/06/19 [phone]

## 2019-09-06 NOTE — Telephone Encounter (Signed)
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 Attempted to reach pt to discuss, no answer, left VM to return call.

## 2019-09-07 ENCOUNTER — Telehealth (HOSPITAL_COMMUNITY): Payer: Self-pay | Admitting: Emergency Medicine

## 2019-09-07 NOTE — Telephone Encounter (Signed)
Attempted to reach patient x2. No answer at this time. Voicemail left.   Letter sent.   

## 2019-09-07 NOTE — Telephone Encounter (Signed)
Pt returned, discussed with her treatment options and follow up. Pt states the sores are mostly healed and she will follow up with PCP.

## 2019-12-14 ENCOUNTER — Encounter: Payer: Self-pay | Admitting: *Deleted

## 2020-02-28 ENCOUNTER — Encounter (HOSPITAL_COMMUNITY): Payer: Self-pay

## 2020-02-28 ENCOUNTER — Other Ambulatory Visit: Payer: Self-pay

## 2020-02-28 ENCOUNTER — Ambulatory Visit (HOSPITAL_COMMUNITY)
Admission: EM | Admit: 2020-02-28 | Discharge: 2020-02-28 | Disposition: A | Discharge status: Home / Self Care | Payer: Self-pay | Attending: Family Medicine | Admitting: Family Medicine

## 2020-02-28 DIAGNOSIS — G5601 Carpal tunnel syndrome, right upper limb: Secondary | ICD-10-CM

## 2020-02-28 DIAGNOSIS — N390 Urinary tract infection, site not specified: Secondary | ICD-10-CM

## 2020-02-28 DIAGNOSIS — Z3202 Encounter for pregnancy test, result negative: Secondary | ICD-10-CM

## 2020-02-28 LAB — POCT URINALYSIS DIP (DEVICE)
Bilirubin Urine: NEGATIVE
Glucose, UA: NEGATIVE mg/dL
Hgb urine dipstick: NEGATIVE
Ketones, ur: NEGATIVE mg/dL
Leukocytes,Ua: NEGATIVE
Nitrite: NEGATIVE
Protein, ur: NEGATIVE mg/dL
Specific Gravity, Urine: >=1.03 (ref 1.005–1.030)
Urobilinogen, UA: 0.2 mg/dL (ref 0.0–1.0)
pH: 5.5 (ref 5.0–8.0)

## 2020-02-28 LAB — POC URINE PREG, ED: Preg Test, Ur: NEGATIVE

## 2020-02-28 MED ORDER — METHYLPREDNISOLONE 4 MG PO TBPK: ORAL_TABLET | ORAL | 0 refills | Status: DC

## 2020-02-28 NOTE — Discharge Instructions (Addendum)
Take the Medrol Dosepak as directed Take all of day 1 today After you finish the Medrol Dosepak take Aleve 2 pills in the morning and 2 pills at night with food Use this until your wrist pain improves I would get an inexpensive brace to wear in your wrist at bedtime.  This should help reduce your discomfort Return as needed

## 2020-02-28 NOTE — ED Triage Notes (Signed)
Pt c/o bladder feels full, urinary urgencyx4 days. Pt c/o right arm is numb upon wakingx4 wks. Pt states the pain starts at the 3rd digit of right hand and shoots up arm.

## 2020-03-02 NOTE — ED Provider Notes (Signed)
[facility]    CSN: [id number] Arrival date & time: 02/28/20  1609      History   Chief Complaint Chief Complaint  Patient presents with  . Urinary Tract Infection    HPI Kelli Parks is a 29 y.o. female.   HPI   Patient is here with 2 problems .  She has some numbness and pain in her hand when she awakens for the last month or so.  Its only in the index finger and thumb.  She does not have numbness or pain during the day.  She states she does use her hands a lot at her work.  Her second problem is some urinary urgency for 4 days.  She feels like she has to go more than usual.  No burning.  No fever.  No chills.  No vaginal discharge  Past Medical History:  Diagnosis Date  . Allergy   . Gastritis     Patient Active Problem List   Diagnosis Date Noted  . Abnormal uterine bleeding 08/27/2018  . Alcohol use 08/27/2018    Past Surgical History:  Procedure Laterality Date  . NO PAST SURGERIES      OB History    Gravida  0   Para  0   Term  0   Preterm  0   AB  0   Living  0     SAB  0   TAB  0   Ectopic  0   Multiple  0   Live Births               Home Medications    Prior to Admission medications   Medication Sig Start Date End Date Taking? Authorizing Provider  methylPREDNISolone (MEDROL DOSEPAK) 4 MG TBPK tablet TAD 02/28/20   Eustace Moore, MD  promethazine (PHENERGAN) 25 MG tablet Take 1 tablet (25 mg total) by mouth every 6 (six) hours as needed for nausea or vomiting. 02/08/19   Lawyer, Cristal Deer, PA-C  sucralfate (CARAFATE) 1 g tablet Take 1 tablet (1 g total) by mouth 4 (four) times daily -  with meals and at bedtime. 02/08/19   Charlestine Night, PA-C    Family History Family History  Problem Relation Age of Onset  . Hypertension Mother   . Diabetes Maternal Grandmother   . Hypertension Maternal Grandmother     Social History Social History   Tobacco Use  . Smoking status: Current Some Day Smoker      Packs/day: 0.10    Types: Cigarettes  . Smokeless tobacco: Never Used  . Tobacco comment: Smokes Black and Milds  Substance Use Topics  . Alcohol use: Yes    Alcohol/week: 20.0 standard drinks    Types: 20 Shots of liquor per week  . Drug use: Yes    Types: Marijuana    Comment: tried cocaine twice     Allergies   Patient has no known allergies.   Review of Systems Review of Systems See HPI  Physical Exam Triage Vital Signs ED Triage Vitals  Enc Vitals Group     BP 02/28/20 1649 132/70     Pulse Rate 02/28/20 1649 66     Resp 02/28/20 1649 16     Temp 02/28/20 1649 98.7 F (37.1 C)     Temp Source 02/28/20 1649 Oral     SpO2 02/28/20 1649 100 %     Weight 02/28/20 1652 160 lb (72.6 kg)     Height 02/28/20 1652  5\' 3"  (1.6 m)     Head Circumference --      Peak Flow --      Pain Score 02/28/20 1650 0     Pain Loc --      Pain Edu? --      Excl. in [place]? --    No data found.  Updated Vital Signs BP 132/70   Pulse 66   Temp 98.7 F (37.1 C) (Oral)   Resp 16   Ht 5\' 3"  (1.6 m)   Wt 72.6 kg   SpO2 100%   BMI 28.34 kg/m      Physical Exam Constitutional:      General: She is not in acute distress.    Appearance: She is well-developed.  HENT:     Head: Normocephalic and atraumatic.     Mouth/Throat:     Comments: Mask is in place Eyes:     Conjunctiva/sclera: Conjunctivae normal.     Pupils: Pupils are equal, round, and reactive to light.  Cardiovascular:     Rate and Rhythm: Normal rate.  Pulmonary:     Effort: Pulmonary effort is normal. No respiratory distress.  Abdominal:     General: There is no distension.     Palpations: Abdomen is soft.     Tenderness: There is no abdominal tenderness. There is no right CVA tenderness or left CVA tenderness.  Musculoskeletal:        General: Normal range of motion.     Cervical back: Normal range of motion.     Comments: Hands appear normal.  No muscle wasting.  Sensory exam is normal.  Phalen's is  positive on the right hand  Skin:    General: Skin is warm and dry.  Neurological:     Mental Status: She is alert.  Psychiatric:        Mood and Affect: Mood normal.        Behavior: Behavior normal.      UC Treatments / Results  Labs (all labs ordered are listed, but only abnormal results are displayed) Labs Reviewed  POC URINE PREG, ED  POCT URINALYSIS DIP (DEVICE)    EKG   Radiology No results found.  Procedures Procedures (including critical care time)  Medications Ordered in UC Medications - No data to display  Initial Impression / Assessment and Plan / UC Course  I have reviewed the triage vital signs and the nursing notes.  Pertinent labs & imaging results that were available during my care of the patient were reviewed by me and considered in my medical decision making (see chart for details).     Discussed carpal tunnel syndrome.  Natural course.  Reasons for referral gust Discussed also that her urine test is negative.  Pregnancy test is negative.  Urinary urgency could be from vaginal irritation, food or liquid ingestion Final Clinical Impressions(s) / UC Diagnoses   Final diagnoses:  Carpal tunnel syndrome of right wrist     Discharge Instructions     Take the Medrol Dosepak as directed Take all of day 1 today After you finish the Medrol Dosepak take Aleve 2 pills in the morning and 2 pills at night with food Use this until your wrist pain improves I would get an inexpensive brace to wear in your wrist at bedtime.  This should help reduce your discomfort Return as needed   ED Prescriptions    Medication Sig Dispense Auth. Provider   methylPREDNISolone (MEDROL DOSEPAK) 4 MG TBPK tablet TAD  21 tablet Eustace Moore, MD     PDMP not reviewed this encounter.   Eustace Moore, MD 03/02/20 1124

## 2020-07-03 ENCOUNTER — Ambulatory Visit (HOSPITAL_COMMUNITY)
Admission: EM | Admit: 2020-07-03 | Discharge: 2020-07-03 | Disposition: A | Discharge status: Home / Self Care | Payer: Self-pay

## 2020-07-03 ENCOUNTER — Other Ambulatory Visit: Payer: Self-pay

## 2020-07-03 ENCOUNTER — Encounter (HOSPITAL_COMMUNITY): Payer: Self-pay | Admitting: Emergency Medicine

## 2020-07-03 DIAGNOSIS — M25531 Pain in right wrist: Secondary | ICD-10-CM

## 2020-07-03 MED ORDER — PREDNISONE 10 MG (21) PO TBPK: ORAL_TABLET | ORAL | 0 refills | Status: DC

## 2020-07-03 NOTE — ED Triage Notes (Signed)
Pt c/o right hand pain and numbness since July. She states she was seen and treated here before with relief of symptoms. Pt states she believes it is carpal tunnel. 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 Pt states the pain is mostly in her thumb and her fingers are numb a lot. Pt states sometimes her whole hand feels numb. She states this is effecting her ADL such as doing her hair or  Being on her phone.

## 2020-07-03 NOTE — Discharge Instructions (Signed)
Wear the splint Take the prednisone as prescribed with food.  Rest, Ice.  See the specialist as needed.

## 2020-07-04 NOTE — ED Provider Notes (Signed)
[facility]    CSN: [id number] Arrival date & time: 07/03/20  1339      History   Chief Complaint Chief Complaint  Patient presents with  . Hand Pain  . Numbness    HPI Kelli Parks is a 29 y.o. female.   Patient is a 29 year old female that presents today with right hand pain, numbness that has been off and on since July.  She is seen here previously for same and diagnosed with carpal tunnel.  Received prednisone to treat at that time which she reports helped.  She uses this splint off and on when the flareups occur.  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  States pain is mostly in her thumb and fingers and she does have some numbness and tingling in the thumb.  Sometimes she also has numbness and tingling in the ring finger.  Grip is slightly weakened at times.  No injuries.  No swelling, bruising or deformities.     Past Medical History:  Diagnosis Date  . Allergy   . Gastritis     Patient Active Problem List   Diagnosis Date Noted  . Abnormal uterine bleeding 08/27/2018  . Alcohol use 08/27/2018    Past Surgical History:  Procedure Laterality Date  . NO PAST SURGERIES      OB History    Gravida  0   Para  0   Term  0   Preterm  0   AB  0   Living  0     SAB  0   TAB  0   Ectopic  0   Multiple  0   Live Births               Home Medications    Prior to Admission medications   Medication Sig Start Date End Date Taking? Authorizing Provider  naproxen (NAPROSYN) 500 MG tablet Take 500 mg by mouth 2 (two) times daily with a meal.   Yes [provider]  predniSONE (STERAPRED UNI-PAK 21 TAB) 10 MG (21) TBPK tablet 6 tabs for 1 day, then 5 tabs for 1 das, then 4 tabs for 1 day, then 3 tabs for 1 day, 2 tabs for 1 day, then 1 tab for 1 day 07/03/20   Dahlia Byes A, NP  promethazine (PHENERGAN) 25 MG tablet Take 1 tablet (25 mg total) by mouth every 6 (six) hours as needed for nausea or vomiting.  02/08/19   Lawyer, Cristal Deer, PA-C  sucralfate (CARAFATE) 1 g tablet Take 1 tablet (1 g total) by mouth 4 (four) times daily -  with meals and at bedtime. 02/08/19   Charlestine Night, PA-C    Family History Family History  Problem Relation Age of Onset  . Hypertension Mother   . Diabetes Maternal Grandmother   . Hypertension Maternal Grandmother     Social History Social History   Tobacco Use  . Smoking status: Current Some Day Smoker    Packs/day: 0.10    Types: Cigarettes  . Smokeless tobacco: Never Used  . Tobacco comment: Smokes Black and Milds  Substance Use Topics  . Alcohol use: Yes    Alcohol/week: 20.0 standard drinks    Types: 20 Shots of liquor per week  . Drug use: Yes    Types: Marijuana    Comment: tried cocaine twice     Allergies   Patient has no known allergies.   Review of Systems Review of Systems  Physical Exam Triage Vital Signs ED Triage Vitals [07/03/20 1524]  Enc Vitals Group     BP      Pulse      Resp      Temp      Temp src      SpO2      Weight      Height      Head Circumference      Peak Flow      Pain Score 5     Pain Loc      Pain Edu?      Excl. in [place]?    No data found.  Updated Vital Signs LMP 06/19/2020   Visual Acuity Right Eye Distance:   Left Eye Distance:   Bilateral Distance:    Right Eye Near:   Left Eye Near:    Bilateral Near:     Physical Exam Vitals and nursing note reviewed.  Constitutional:      General: She is not in acute distress.    Appearance: Normal appearance. She is not ill-appearing, toxic-appearing or diaphoretic.  HENT:     Head: Normocephalic.     Nose: Nose normal.     Mouth/Throat:     Pharynx: Oropharynx is clear.  Eyes:     Conjunctiva/sclera: Conjunctivae normal.  Pulmonary:     Effort: Pulmonary effort is normal.  Abdominal:     Palpations: Abdomen is soft.     Tenderness: There is no abdominal tenderness.  Musculoskeletal:        General: Normal range of  motion.     Right wrist: Tenderness present. No swelling, deformity, effusion, lacerations, bony tenderness, snuff box tenderness or crepitus. Normal range of motion. Normal pulse.     Cervical back: Normal range of motion.     Comments: Positive Phalen   Skin:    General: Skin is warm and dry.     Findings: No rash.  Neurological:     Mental Status: She is alert.  Psychiatric:        Mood and Affect: Mood normal.      UC Treatments / Results  Labs (all labs ordered are listed, but only abnormal results are displayed) Labs Reviewed - No data to display  EKG   Radiology No results found.  Procedures Procedures (including critical care time)  Medications Ordered in UC Medications - No data to display  Initial Impression / Assessment and Plan / UC Course  I have reviewed the triage vital signs and the nursing notes.  Pertinent labs & imaging results that were available during my care of the patient were reviewed by me and considered in my medical decision making (see chart for details).     Right wrist pain Most likely carpal tunnel. She was also dx with this in July.  Will treat with prednisone taper and have her to continue wearing the splint.  Rest, Ice.  Work note given Sports med for follow up Final Clinical Impressions(s) / UC Diagnoses   Final diagnoses:  Right wrist pain     Discharge Instructions     Wear the splint Take the prednisone as prescribed with food.  Rest, Ice.  See the specialist as needed.     ED Prescriptions    Medication Sig Dispense Auth. Provider   predniSONE (STERAPRED UNI-PAK 21 TAB) 10 MG (21) TBPK tablet 6 tabs for 1 day, then 5 tabs for 1 das, then 4 tabs for 1 day, then 3 tabs for 1  day, 2 tabs for 1 day, then 1 tab for 1 day 21 tablet Vivian Okelley A, NP     PDMP not reviewed this encounter.   Janace Aris, NP 07/04/20 1359

## 2021-02-15 ENCOUNTER — Other Ambulatory Visit: Payer: Self-pay

## 2021-02-15 ENCOUNTER — Encounter (HOSPITAL_COMMUNITY): Payer: Self-pay

## 2021-02-15 ENCOUNTER — Ambulatory Visit (HOSPITAL_COMMUNITY)
Admission: EM | Admit: 2021-02-15 | Discharge: 2021-02-15 | Disposition: A | Discharge status: Home / Self Care | Payer: Managed Care, Other (non HMO) | Attending: Urgent Care | Admitting: Urgent Care

## 2021-02-15 DIAGNOSIS — R3915 Urgency of urination: Secondary | ICD-10-CM

## 2021-02-15 LAB — POCT URINALYSIS DIPSTICK, ED / UC
Bilirubin Urine: NEGATIVE
Glucose, UA: NEGATIVE mg/dL
Hgb urine dipstick: NEGATIVE
Ketones, ur: NEGATIVE mg/dL
Leukocytes,Ua: NEGATIVE
Nitrite: NEGATIVE
Protein, ur: NEGATIVE mg/dL
Specific Gravity, Urine: 1.015 (ref 1.005–1.030)
Urobilinogen, UA: 0.2 mg/dL (ref 0.0–1.0)
pH: 7 (ref 5.0–8.0)

## 2021-02-15 LAB — POC URINE PREG, ED: Preg Test, Ur: NEGATIVE

## 2021-02-15 NOTE — ED Triage Notes (Signed)
Pt present lower abdominal and back pain. Pt states symptom started a week ago.

## 2021-02-15 NOTE — Discharge Instructions (Addendum)
Make sure you are drinking plenty fluids, especially water.   You can take AZO or cranberry pills for symptom management.    You can contact [facility] for your yearly pap smear and exam.

## 2021-02-15 NOTE — ED Provider Notes (Signed)
[facility]    CSN: [id number] Arrival date & time: 02/15/21  1803      History   Chief Complaint Chief Complaint  Patient presents with   Back Pain   Abdominal Pain    HPI Kelli Parks is a 30 y.o. female.   Patient here for evaluation of lower abdominal or back pain that started approximately 1 week ago.  Reports having some urgency but states that it feels like it takes a long time to urinate.  Denies any dysuria.  Denies any vaginal discharge or odors.  Reports drinking a lot of water.  Denies any trauma, injury, or other precipitating event.  Denies any specific alleviating or aggravating factors.  Denies any fevers, chest pain, shortness of breath, N/V/D, numbness, tingling, weakness, abdominal pain, or headaches.     The history is provided by the patient.  Back Pain Associated symptoms: abdominal pain   Associated symptoms: no dysuria   Abdominal Pain Associated symptoms: no dysuria, no hematuria, no vaginal bleeding and no vaginal discharge    Past Medical History:  Diagnosis Date   Allergy    Gastritis     Patient Active Problem List   Diagnosis Date Noted   Abnormal uterine bleeding 08/27/2018   Alcohol use 08/27/2018    Past Surgical History:  Procedure Laterality Date   NO PAST SURGERIES      OB History     Gravida  0   Para  0   Term  0   Preterm  0   AB  0   Living  0      SAB  0   IAB  0   Ectopic  0   Multiple  0   Live Births               Home Medications    Prior to Admission medications   Medication Sig Start Date End Date Taking? Authorizing Provider  naproxen (NAPROSYN) 500 MG tablet Take 500 mg by mouth 2 (two) times daily with a meal.    [provider]  predniSONE (STERAPRED UNI-PAK 21 TAB) 10 MG (21) TBPK tablet 6 tabs for 1 day, then 5 tabs for 1 das, then 4 tabs for 1 day, then 3 tabs for 1 day, 2 tabs for 1 day, then 1 tab for 1 day 07/03/20   Dahlia Byes A, NP  promethazine  (PHENERGAN) 25 MG tablet Take 1 tablet (25 mg total) by mouth every 6 (six) hours as needed for nausea or vomiting. 02/08/19   Lawyer, Cristal Deer, PA-C  sucralfate (CARAFATE) 1 g tablet Take 1 tablet (1 g total) by mouth 4 (four) times daily -  with meals and at bedtime. 02/08/19   Lawyer, Cristal Deer, PA-C    Family History Family History  Problem Relation Age of Onset   Hypertension Mother    Diabetes Maternal Grandmother    Hypertension Maternal Grandmother     Social History Social History   Tobacco Use   Smoking status: Some Days    Packs/day: 0.10    Pack years: 0.00    Types: Cigarettes   Smokeless tobacco: Never   Tobacco comments:    Smokes Black and Milds  Substance Use Topics   Alcohol use: Yes    Alcohol/week: 20.0 standard drinks    Types: 20 Shots of liquor per week   Drug use: Yes    Types: Marijuana    Comment: tried cocaine twice     Allergies  Patient has no known allergies.   Review of Systems Review of Systems  Gastrointestinal:  Positive for abdominal pain.  Genitourinary:  Positive for urgency. Negative for dysuria, hematuria, vaginal bleeding, vaginal discharge and vaginal pain.  Musculoskeletal:  Positive for back pain.  All other systems reviewed and are negative.   Physical Exam Triage Vital Signs ED Triage Vitals  Enc Vitals Group     BP 02/15/21 1910 133/82     Pulse Rate 02/15/21 1910 64     Resp 02/15/21 1910 16     Temp 02/15/21 1910 98.9 F (37.2 C)     Temp Source 02/15/21 1910 Oral     SpO2 02/15/21 1910 100 %     Weight --      Height --      Head Circumference --      Peak Flow --      Pain Score 02/15/21 1911 6     Pain Loc --      Pain Edu? --      Excl. in GC? --    No data found.  Updated Vital Signs BP 133/82 (BP Location: Left Arm)   Pulse 64   Temp 98.9 F (37.2 C) (Oral)   Resp 16   LMP 01/25/2021   SpO2 100%   Visual Acuity Right Eye Distance:   Left Eye Distance:   Bilateral Distance:     Right Eye Near:   Left Eye Near:    Bilateral Near:     Physical Exam Vitals and nursing note reviewed.  Constitutional:      General: She is not in acute distress.    Appearance: Normal appearance. She is not ill-appearing, toxic-appearing or diaphoretic.  HENT:     Head: Normocephalic and atraumatic.  Eyes:     Conjunctiva/sclera: Conjunctivae normal.  Cardiovascular:     Rate and Rhythm: Normal rate.     Pulses: Normal pulses.  Pulmonary:     Effort: Pulmonary effort is normal.  Abdominal:     General: Abdomen is flat.     Palpations: Abdomen is soft.     Tenderness: There is no abdominal tenderness. There is no right CVA tenderness or left CVA tenderness.  Musculoskeletal:        General: Normal range of motion.     Cervical back: Normal range of motion.  Skin:    General: Skin is warm and dry.  Neurological:     General: No focal deficit present.     Mental Status: She is alert and oriented to person, place, and time.  Psychiatric:        Mood and Affect: Mood normal.     UC Treatments / Results  Labs (all labs ordered are listed, but only abnormal results are displayed) Labs Reviewed  POCT URINALYSIS DIPSTICK, ED / UC  POC URINE PREG, ED    EKG   Radiology No results found.  Procedures Procedures (including critical care time)  Medications Ordered in UC Medications - No data to display  Initial Impression / Assessment and Plan / UC Course  I have reviewed the triage vital signs and the nursing notes.  Pertinent labs & imaging results that were available during my care of the patient were reviewed by me and considered in my medical decision making (see chart for details).     Assessment negative for red flags or concerns.  Urinalysis with no signs of infection and urine pregnancy test negative.  Recommend drinking plenty of water  and urinating when she needs to and not holding it.  May try AZO or cranberry pills for symptom management.  Patient  given [facility] for further evaluation and annual Pap smear.  Declines STI testing at this time.  PCP assistance started. Final Clinical Impressions(s) / UC Diagnoses   Final diagnoses:  Urinary urgency     Discharge Instructions      Make sure you are drinking plenty fluids, especially water.   You can take AZO or cranberry pills for symptom management.    You can contact [facility] for your yearly pap smear and exam.       ED Prescriptions   None    PDMP not reviewed this encounter.   Ivette Loyal, NP 02/15/21 [phone]

## 2021-06-06 ENCOUNTER — Encounter: Payer: Self-pay | Admitting: Student

## 2021-06-06 ENCOUNTER — Ambulatory Visit (INDEPENDENT_AMBULATORY_CARE_PROVIDER_SITE_OTHER): Payer: Managed Care, Other (non HMO) | Admitting: Student

## 2021-06-06 ENCOUNTER — Other Ambulatory Visit: Payer: Self-pay

## 2021-06-06 DIAGNOSIS — Z789 Other specified health status: Secondary | ICD-10-CM | POA: Diagnosis not present

## 2021-06-06 DIAGNOSIS — Z0000 Encounter for general adult medical examination without abnormal findings: Secondary | ICD-10-CM

## 2021-06-06 DIAGNOSIS — K2970 Gastritis, unspecified, without bleeding: Secondary | ICD-10-CM

## 2021-06-06 DIAGNOSIS — R4586 Emotional lability: Secondary | ICD-10-CM

## 2021-06-06 DIAGNOSIS — H04203 Unspecified epiphora, bilateral lacrimal glands: Secondary | ICD-10-CM

## 2021-06-06 DIAGNOSIS — B009 Herpesviral infection, unspecified: Secondary | ICD-10-CM

## 2021-06-06 NOTE — Progress Notes (Signed)
New Patient Office Visit  Subjective:  Patient ID: Kelli Parks, female    DOB: [birth year]  Age: 30 y.o. MRN: [id number]  CC:  Chief Complaint  Patient presents with   New Patient (Initial Visit)    Has not had a pap in years   right eye twitching    Since January - no pain   therapist   knee and foot pain   hsv   HPI Kelli Parks presents for establishing care and receiving pap smear. 3 little sister and brother- oldest child, dad has a. 1 biological sibling. Main complaints are wanting Pap smear, eye twitches and has excessive tearing sometimes, has some urinary hesitancy after having a UTI in 2018-2019.  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  Anxiety Patient had an anxiety attack last year and on Sunday felt very anxious with elevated heart rates.  Would like counseling for this. Has not been formally diagnosed.  Alcohol use disorder Currently drinking 19 standard drinks a week.  4 shots of liquor during the week, and on the weekend is drinking again 7 shots on Saturday and 7 shots on Sunday approximately.  Sometimes she drinks personal boxed wine.  Has been having sleep issues as well.  Gastritis Likely related to alcohol use. Currently not taking any medications.  Eye Tearing Says she tears up during times she is not actually sad. Her eyes water on and off for years. She had not seen any eye doctor about this before. Sometimes itches eyes in morning.  Past Medical History:  Diagnosis Date   Allergy    Gastritis    HSV-2 (herpes simplex virus 2) infection     Past Surgical History:  Procedure Laterality Date   NO PAST SURGERIES      Family History  Problem Relation Age of Onset   Hypertension Mother    Anxiety disorder Mother    Depression Mother    Drug abuse Mother    High blood pressure Father    Alcohol abuse Father    Diabetes Maternal Grandmother    Hypertension Maternal  Grandmother     Social History   Socioeconomic History   Marital status: Single    Spouse name: Not on file   Number of children: 0   Years of education: 13.5   Highest education level: Some college, no degree  Occupational History   Occupation: English as a second language teacher  Tobacco Use   Smoking status: Some Days    Packs/day: 0.00    Types: Cigarettes    Start date: 2009   Smokeless tobacco: Never   Tobacco comments:    4 cigarettes a week. Smokes Black and [employer]  Vaping Use   Vaping Use: Former  Substance and Sexual Activity   Alcohol use: Not Currently    Alcohol/week: 19.0 standard drinks    Types: 1 Glasses of wine, 18 Shots of liquor per week    Comment: 4x a week   Drug use: Yes    Types: Marijuana    Comment: tried cocaine twice 2021. 2 joints a day   Sexual activity: Yes    Partners: Male    Comment: 2 partners, uses protection every time, no BC, wants children but not right now  Other Topics Concern   Not on file  Social History Narrative   Not on file   Social Determinants of Health   Financial Resource Strain:  Not on file  Food Insecurity: Not on file  Transportation Needs: Not on file  Physical Activity: Not on file  Stress: Not on file  Social Connections: Not on file  Intimate Partner Violence: Not on file   No food or drug allergies known  ROS Review of Systems  Eyes:  Negative for redness.  Respiratory:  Negative for shortness of breath.   Cardiovascular:  Negative for chest pain and leg swelling.  Gastrointestinal:  Negative for abdominal pain, constipation and diarrhea.  Genitourinary:  Positive for difficulty urinating.  Skin:  Negative for rash.   Objective:   Today's Vitals: BP 128/80   Pulse 62   Ht 5' 3.5" (1.613 m)   Wt 144 lb (65.3 kg)   LMP 05/21/2021   SpO2 98%   BMI 25.11 kg/m   Physical Exam Constitutional:      General: She is not in acute distress.    Appearance: She is not toxic-appearing.  HENT:     Head:  Normocephalic and atraumatic.  Eyes:     Conjunctiva/sclera: Conjunctivae normal.     Comments: Tearing present bilaterally  Cardiovascular:     Rate and Rhythm: Normal rate and regular rhythm.     Pulses: Normal pulses.     Heart sounds: No murmur heard.   No friction rub. No gallop.  Pulmonary:     Effort: Pulmonary effort is normal.     Breath sounds: Normal breath sounds. No wheezing or rales.  Abdominal:     General: Bowel sounds are normal. There is no distension.     Palpations: Abdomen is soft. There is no mass.     Comments: No suprabupic tenderness. Mild mid-line tenderness to deep palpation  Musculoskeletal:        General: No swelling or tenderness. Normal range of motion.     Cervical back: Normal range of motion. No tenderness.  Skin:    General: Skin is warm and dry.     Capillary Refill: Capillary refill takes less than 2 seconds.  Neurological:     General: No focal deficit present.     Mental Status: She is alert.    Assessment & Plan:   Problem List Items Addressed This Visit       Digestive   Gastritis    No medications for this currently. Likely related to alcohol use in the past. -f/u at future appointments        Other   Alcohol use    Currently 19 standard drinks a week. Encourage decreasing as can affect anxiety and sleep as well. -gave list of counselors for anxiety as well as talking about alcohol use      Excessive tearing, bilateral    Patient had tearing throughout exam, however not sad. Could be related to allergies. -Advised patient to go to eye doctor/optometrist       Healthcare maintenance    Has not received a pap before, and has not had much follow up with PCP before. -2 week f/u for pap smear first      HSV-2 (herpes simplex virus 2) infection    No active infection, no antivirals currently. -Can advice reaching out to clinic if occurrence happens for prescription      Mood changes    PHQ-9 of 10 today, no thoughts of  suicide. Has had some anxiety and sleep issues as well. Alcohol use can play into this as well. -gave list of counselors in AVS for patient to reach out  to        Outpatient Encounter Medications as of 06/06/2021  Medication Sig   naproxen (NAPROSYN) 500 MG tablet Take 500 mg by mouth 2 (two) times daily with a meal. (Patient not taking: Reported on 06/06/2021)   predniSONE (STERAPRED UNI-PAK 21 TAB) 10 MG (21) TBPK tablet 6 tabs for 1 day, then 5 tabs for 1 das, then 4 tabs for 1 day, then 3 tabs for 1 day, 2 tabs for 1 day, then 1 tab for 1 day (Patient not taking: Reported on 06/06/2021)   promethazine (PHENERGAN) 25 MG tablet Take 1 tablet (25 mg total) by mouth every 6 (six) hours as needed for nausea or vomiting. (Patient not taking: Reported on 06/06/2021)   sucralfate (CARAFATE) 1 g tablet Take 1 tablet (1 g total) by mouth 4 (four) times daily -  with meals and at bedtime. (Patient not taking: Reported on 06/06/2021)   No facility-administered encounter medications on file as of 06/06/2021.    Follow-up: Return in about 2 weeks (around 06/20/2021).   Levin Erp, MD

## 2021-06-06 NOTE — Patient Instructions (Addendum)
It was great to see you! Thank you for allowing me to participate in your care!   I recommend that you always bring your medications to each appointment as this makes it easy to ensure we are on the correct medications and helps [place] not miss when refills are needed.  Our plans for today:  - Anxiety counseling numbers below and I encourage you to cut down on alcohol as this can also contribute to anxiety - We will follow up at visits for pap smear, urinary hesitancy, knee pain. Please schedule an appointment with your eye doctor for your tearing as well!  Take care and seek immediate care sooner if you develop any concerns. Please remember to show up 15 minutes before your scheduled appointment time!  Levin Erp, MD [facility]   Therapy and Counseling Resources Most providers on this list will take Medicaid. Patients with commercial insurance or Medicare should contact their insurance company to get a list of in network providers.  [facility]:Psychiatry and Counseling [address] [facility] [address] [place], [place] [address] [phone]  [facility]  [address] Suite [place], [place] [address]      [phone]  [facility] [address]  [place], [place] [address] [phone]  [facility] [address]  [place], [place] [address]       [phone]     [facility] (virtual only) [phone]  Jovita Kussmaul [facility] [address] [place], [place] [phone]  [facility]:  [address] [place] [phone]  [facility]:  [address] [place] [place]iener [phone]  [facility] (under & uninsured) [address]   [place] [phone]    [email or web address]    [facility] [address]  [place]    [phone]  [facility] -[address]     Phone:  [phone]     [facility]-  [address]  [phone]   [facility] [address]      [place], [place] [phone]  [facility]: [address], [place], [place]  [phone]   [facility] (Spanish therapist) [email or web address]  [address] [place]  [address]   [place] [address]    [phone]    [facility]   [address],  [place], [place]  [phone]   [facility]  [address] [place]  [phone]  [facility]  [address] [place], [place]        [phone]  Open Access/Walk In Clinic under & uninsured  [facility]  [address] [place], [place] [phone] Crisis [phone]  [facility] [place],  (Spanish)   [address], [place]: [phone]) 8:30 - 12; 1 - 2:30  [facility] [employer],  [address] [place], [place]    [phone]):8:30 - 12; 2 - 3PM  [facility] [employer],  [address]  [place]; [phone]):   Mon - Fri 8 AM - 5 PM  [facility] [address] [place]  MWF 12:30 to 3:00 or call to schedule an appointment  [phone]  Specific Provider options Psychology Today  [email or web address] click on find a therapist  enter your zip code left side  and select or tailor a therapist for your specific need.   [facility] Provider Directory [email or web address]  (Medicaid)   Follow all drop down to find a provider  Social Support program [facility] [phone] or PhotoS[employer].pl [address], [place], [place] Recovery support and educational   24- Hour Availability:   [facility]  [address] [place], [place] [phone] Crisis [phone]  [facility]  [phone]  [facility]  [phone]   [facility]  [phone] (after hours)  [facility]rapeutic [facility]/Mobile Crisis   [phone]  [place] National Suicide Hotline  [phone] Len Childs)  Call 911 or go to emergency room  [facility]  [phone]);  [place] and [employer]  [phone]); [place], [place], [place], [place], [place], [place], [place]

## 2021-06-07 ENCOUNTER — Encounter: Payer: Self-pay | Admitting: Student

## 2021-06-07 DIAGNOSIS — H04203 Unspecified epiphora, bilateral lacrimal glands: Secondary | ICD-10-CM | POA: Insufficient documentation

## 2021-06-07 DIAGNOSIS — B009 Herpesviral infection, unspecified: Secondary | ICD-10-CM | POA: Insufficient documentation

## 2021-06-07 DIAGNOSIS — R4586 Emotional lability: Secondary | ICD-10-CM | POA: Insufficient documentation

## 2021-06-07 DIAGNOSIS — K2970 Gastritis, unspecified, without bleeding: Secondary | ICD-10-CM | POA: Insufficient documentation

## 2021-06-07 DIAGNOSIS — Z0000 Encounter for general adult medical examination without abnormal findings: Secondary | ICD-10-CM | POA: Insufficient documentation

## 2021-06-07 NOTE — Assessment & Plan Note (Signed)
Currently 19 standard drinks a week. Encourage decreasing as can affect anxiety and sleep as well. -gave list of counselors for anxiety as well as talking about alcohol use

## 2021-06-07 NOTE — Assessment & Plan Note (Signed)
No active infection, no antivirals currently. -Can advice reaching out to clinic if occurrence happens for prescription

## 2021-06-07 NOTE — Assessment & Plan Note (Signed)
Has not received a pap before, and has not had much follow up with PCP before. -2 week f/u for pap smear first

## 2021-06-07 NOTE — Assessment & Plan Note (Addendum)
PHQ-9 of 10 today, no thoughts of suicide. Has had some anxiety and sleep issues as well. Alcohol use can play into this as well. -gave list of counselors in AVS for patient to reach out to

## 2021-06-07 NOTE — Assessment & Plan Note (Addendum)
No medications for this currently. Likely related to alcohol use in the past. -f/u at future appointments

## 2021-06-07 NOTE — Assessment & Plan Note (Signed)
Patient had tearing throughout exam, however not sad. Could be related to allergies. -Advised patient to go to eye doctor/optometrist

## 2021-07-03 ENCOUNTER — Encounter: Payer: Self-pay | Admitting: Family Medicine

## 2021-07-03 ENCOUNTER — Other Ambulatory Visit: Payer: Self-pay

## 2021-07-03 ENCOUNTER — Ambulatory Visit (INDEPENDENT_AMBULATORY_CARE_PROVIDER_SITE_OTHER): Payer: Managed Care, Other (non HMO) | Admitting: Family Medicine

## 2021-07-03 ENCOUNTER — Other Ambulatory Visit (HOSPITAL_COMMUNITY)
Admission: RE | Admit: 2021-07-03 | Discharge: 2021-07-03 | Disposition: A | Discharge status: Home / Self Care | Payer: Managed Care, Other (non HMO) | Source: Ambulatory Visit | Attending: Family Medicine | Admitting: Family Medicine

## 2021-07-03 VITALS — BP 118/70 | HR 70 | Ht 63.5 in | Wt 148.0 lb

## 2021-07-03 DIAGNOSIS — Z0000 Encounter for general adult medical examination without abnormal findings: Secondary | ICD-10-CM

## 2021-07-03 DIAGNOSIS — Z124 Encounter for screening for malignant neoplasm of cervix: Secondary | ICD-10-CM | POA: Insufficient documentation

## 2021-07-03 DIAGNOSIS — Z113 Encounter for screening for infections with a predominantly sexual mode of transmission: Secondary | ICD-10-CM

## 2021-07-03 NOTE — Patient Instructions (Signed)
It was great seeing you today!   Regarding lab work today:  Due to recent changes in healthcare laws, you may see the results of your imaging and laboratory studies on MyChart before your provider has had a chance to review them.  I understand that in some cases there may be results that are confusing or concerning to you. Not all laboratory results come back in the same time frame and you may be waiting for multiple results in order to interpret others.  Please give [place] 72 hours in order for your provider to thoroughly review all the results before contacting the office for clarification of your results. If everything is normal, you will get a letter in the mail or a message in My Chart. Please give [place] a call if you do not hear from [place] after 2 weeks.  Feel free to call with any questions or concerns at any time, at [phone].   Take care,  Dr. Katherina [facility]

## 2021-07-03 NOTE — Progress Notes (Addendum)
   SUBJECTIVE:   CHIEF COMPLAINT / HPI:   Chief Complaint  Patient presents with   Right Eye twitching    Gynecologic Exam   Annual Exam     Kelli Parks is a 30 y.o. female here for annual physical.    History tabs reviewed and updated.  She walks for exercise.  Review of systems form reviewed and notable for right eye twitching.   PERTINENT  PMH / PSH: reviewed and updated as appropriate   OBJECTIVE:   BP 118/70   Pulse 70   Ht 5' 3.5" (1.613 m)   Wt 148 lb (67.1 kg)   LMP 06/29/2021   SpO2 100%   BMI 25.81 kg/m    GEN:     alert, well-appearing and no distress    EYES:   pupils equal and reactive, EOM intact NECK:  supple, normal ROM, no lymphadenopathy  RESP:  clear to auscultation bilaterally, no increased work of breathing  CVS:   regular rate and rhythm, no murmur, distal pulses intact   ABD:  soft, non-tender; bowel sounds present; no palpable masses GU:  normal female, PAP collected, normal vagina, adnexa and cervix without lesions or discharge EXT:   normal ROM, atraumatic, no edema  NEURO:  normal without focal findings,  speech normal, alert and oriented   Skin:   warm and dry, no rash, normal skin turgor Psych: Normal affect, appropriate speech and behavior    ASSESSMENT/PLAN:      Right eye twitching not discussed with patient as she does not address this is a new concern.  Inquire on caffeine intake at next visit.  Annual Examination  See AVS for age appropriate recommendations.   PHQ score 8, reviewed and discussed.  Blood pressure reviewed and at goal.  Asked about intimate partner violence and resources given as appropriate  The patient currently uses condoms for contraception as she would be okay if she got pregnant. Folate recommended as appropriate, minimum of 400 mcg per day.  Patient smokes cigarettes (1-2 cig/day) Occasionally drinks alcohol.  Does use marijuana regularly.  Encourage cessation of THC and cigarettes.  She has  somewhat ready to quit smoking.  Considered the following items based upon USPSTF recommendations: Diabetes screening: discussed and ordered Screening for elevated cholesterol: discussed and ordered HIV testing: discussed and ordered Hepatitis C: discussed and ordered Hepatitis B: discussed Syphilis if at high risk: discussed and ordered GC/CT at high risk, ordered.  Reviewed risk factors for latent tuberculosis and not indicated   Discussed family history, BRCA testing not indicated.  Cervical cancer screening: due for Pap today, cytology alone ordered (HPV if ASCUS) Breast cancer screening:  N/A due to age Colorectal cancer screening: not applicable given age.    Follow up in 1  year or sooner if indicated.    Lyndee Hensen, DO PGY-3, [facility] 07/03/2021

## 2021-07-04 LAB — HIV ANTIBODY (ROUTINE TESTING W REFLEX): HIV Screen 4th Generation wRfx: NONREACTIVE

## 2021-07-04 LAB — HEMOGLOBIN A1C
Est. average glucose Bld gHb Est-mCnc: 105 mg/dL
Hgb A1c MFr Bld: 5.3 % (ref 4.8–5.6)

## 2021-07-04 LAB — LIPID PANEL
Chol/HDL Ratio: 1.9 ratio (ref 0.0–4.4)
Cholesterol, Total: 147 mg/dL (ref 100–199)
HDL: 79 mg/dL (ref >39)
LDL Chol Calc (NIH): 58 mg/dL (ref 0–99)
Triglycerides: 43 mg/dL (ref 0–149)
VLDL Cholesterol Cal: 10 mg/dL (ref 5–40)

## 2021-07-05 LAB — T PALLIDUM ANTIBODY, EIA: T pallidum Antibody, EIA: NEGATIVE

## 2021-07-05 LAB — RPR W/REFLEX TO TREPSURE: RPR: NONREACTIVE

## 2021-07-06 ENCOUNTER — Encounter: Payer: Self-pay | Admitting: Family Medicine

## 2021-07-06 NOTE — Addendum Note (Signed)
Addended by: Katha Cabal D on: 07/06/2021 08:37 PM   Modules accepted: Orders

## 2021-07-08 ENCOUNTER — Ambulatory Visit (HOSPITAL_COMMUNITY)
Admission: EM | Admit: 2021-07-08 | Discharge: 2021-07-08 | Disposition: A | Discharge status: Home / Self Care | Payer: Managed Care, Other (non HMO) | Attending: Student | Admitting: Student

## 2021-07-08 ENCOUNTER — Other Ambulatory Visit: Payer: Self-pay

## 2021-07-08 ENCOUNTER — Encounter (HOSPITAL_COMMUNITY): Payer: Self-pay | Admitting: *Deleted

## 2021-07-08 DIAGNOSIS — K30 Functional dyspepsia: Secondary | ICD-10-CM | POA: Diagnosis not present

## 2021-07-08 MED ORDER — DICYCLOMINE HCL 20 MG PO TABS: 20.0000 mg | ORAL_TABLET | Freq: Two times a day (BID) | ORAL | 0 refills | Status: DC

## 2021-07-08 NOTE — ED Triage Notes (Signed)
Pt reports on and off ABD pain with a Hx of constipation. Pt voiced concern of being constipated. Sx's since Thursday .

## 2021-07-08 NOTE — ED Provider Notes (Addendum)
[facility]    CSN: [id number] Arrival date & time: 07/08/21  1432      History   Chief Complaint Chief Complaint  Patient presents with   Abdominal Pain    HPI Kelli Parks is a 30 y.o. female presenting with abd pain. Medical history gastritis, AUB.  Describes about 1 week of crampy lower abdominal pain and gassiness following trying a new probiotic.  She stopped this about 5 days ago but the pain persists.  She is having 1-2 regular bowel movements daily and is not requiring laxative to induce them.  Still passing gas.  Denies nausea, vomiting, diarrhea.  Gastritis is currently well controlled on no medications.  Denies black or tarry stool or blood in stool.  Patient states that she has IBS but has not been seen by a gastroenterologist for this. Denies urinary symptoms.  HPI  Past Medical History:  Diagnosis Date   Allergy    Gastritis    HSV-2 (herpes simplex virus 2) infection     Patient Active Problem List   Diagnosis Date Noted   HSV-2 (herpes simplex virus 2) infection    Healthcare maintenance    Gastritis    Excessive tearing, bilateral    Mood changes    Abnormal uterine bleeding 08/27/2018   Alcohol use 08/27/2018    Past Surgical History:  Procedure Laterality Date   NO PAST SURGERIES      OB History     Gravida  0   Para  0   Term  0   Preterm  0   AB  0   Living  0      SAB  0   IAB  0   Ectopic  0   Multiple  0   Live Births               Home Medications    Prior to Admission medications   Medication Sig Start Date End Date Taking? Authorizing Provider  dicyclomine (BENTYL) 20 MG tablet Take 1 tablet (20 mg total) by mouth 2 (two) times daily. 07/08/21  Yes Rhys Martini, PA-C    Family History Family History  Problem Relation Age of Onset   Hypertension Mother    Anxiety disorder Mother    Depression Mother    Drug abuse Mother    High blood pressure Father    Alcohol abuse Father     Diabetes Maternal Grandmother    Hypertension Maternal Grandmother     Social History Social History   Tobacco Use   Smoking status: Some Days    Packs/day: 0.00    Types: Cigarettes    Start date: 2009   Smokeless tobacco: Never   Tobacco comments:    4 cigarettes a week. Smokes [employer] and [employer]  Vaping Use   Vaping Use: Former  Substance Use Topics   Alcohol use: Not Currently    Alcohol/week: 19.0 standard drinks    Types: 1 Glasses of wine, 18 Shots of liquor per week    Comment: 4x a week   Drug use: Yes    Types: Marijuana    Comment: tried cocaine twice 2021. 2 joints a day     Allergies   Patient has no known allergies.   Review of Systems Review of Systems  Constitutional:  Negative for appetite change, chills, diaphoresis, fever and unexpected weight change.  HENT:  Negative for congestion, ear pain, sinus pressure, sinus pain, sneezing, sore throat and trouble  swallowing.   Respiratory:  Negative for cough, chest tightness and shortness of breath.   Cardiovascular:  Negative for chest pain.  Gastrointestinal:  Positive for abdominal pain. Negative for abdominal distention, anal bleeding, blood in stool, constipation, diarrhea, nausea, rectal pain and vomiting.  Genitourinary:  Negative for dysuria, flank pain, frequency and urgency.  Musculoskeletal:  Negative for back pain and myalgias.  Neurological:  Negative for dizziness, light-headedness and headaches.  All other systems reviewed and are negative.   Physical Exam Triage Vital Signs ED Triage Vitals  Enc Vitals Group     BP      Pulse      Resp      Temp      Temp src      SpO2      Weight      Height      Head Circumference      Peak Flow      Pain Score      Pain Loc      Pain Edu?      Excl. in GC?    No data found.  Updated Vital Signs BP 138/82   Pulse 61   Temp 98.4 F (36.9 C)   Resp 18   LMP 06/29/2021   SpO2 100%   Visual Acuity Right Eye Distance:   Left Eye  Distance:   Bilateral Distance:    Right Eye Near:   Left Eye Near:    Bilateral Near:     Physical Exam Vitals reviewed.  Constitutional:      General: She is not in acute distress.    Appearance: Normal appearance. She is not ill-appearing.  HENT:     Head: Normocephalic and atraumatic.     Mouth/Throat:     Mouth: Mucous membranes are moist.     Comments: Moist mucous membranes Eyes:     Extraocular Movements: Extraocular movements intact.     Pupils: Pupils are equal, round, and reactive to light.  Cardiovascular:     Rate and Rhythm: Normal rate and regular rhythm.     Heart sounds: Normal heart sounds.  Pulmonary:     Effort: Pulmonary effort is normal.     Breath sounds: Normal breath sounds. No wheezing, rhonchi or rales.  Abdominal:     General: Bowel sounds are normal. There is no distension.     Palpations: Abdomen is soft. There is no mass.     Tenderness: There is no abdominal tenderness. There is no right CVA tenderness, left CVA tenderness, guarding or rebound. Negative signs include Murphy's sign, Rovsing's sign and McBurney's sign.     Comments: Generalized lower abd tenderness without guarding mass or rebound. Comfortable throughout exam. BS positive throughout.  Skin:    General: Skin is warm.     Capillary Refill: Capillary refill takes less than 2 seconds.     Comments: Good skin turgor  Neurological:     General: No focal deficit present.     Mental Status: She is alert and oriented to person, place, and time.  Psychiatric:        Mood and Affect: Mood normal.        Behavior: Behavior normal.     UC Treatments / Results  Labs (all labs ordered are listed, but only abnormal results are displayed) Labs Reviewed - No data to display  EKG   Radiology No results found.  Procedures Procedures (including critical care time)  Medications Ordered in UC Medications -  No data to display  Initial Impression / Assessment and Plan / UC Course  I  have reviewed the triage vital signs and the nursing notes.  Pertinent labs & imaging results that were available during my care of the patient were reviewed by me and considered in my medical decision making (see chart for details).     This patient is a very pleasant 30 y.o. year old female presenting with suspected IBS. Suspect current flare is related to probiotic; stop this. Bentyl sent for symptomatic relief. F/u with GI if symptoms persist. ED return precautions discussed. Patient verbalizes understanding and agreement. LMP 11/11; States she is not pregnant or breastfeeding.     Final Clinical Impressions(s) / UC Diagnoses   Final diagnoses:  Persistent indigestion     Discharge Instructions      -Bentyl (dicyclomine) as needed for abdominal cramping, up to 2x daily.  -Try stopping the probiotic for a week and see if that helps     ED Prescriptions     Medication Sig Dispense Auth. Provider   dicyclomine (BENTYL) 20 MG tablet Take 1 tablet (20 mg total) by mouth 2 (two) times daily. 22 tablet Rhys Martini, PA-C      PDMP not reviewed this encounter.   Rhys Martini, PA-C 07/08/21 1724    Rhys Martini, PA-C 07/08/21 1724

## 2021-07-08 NOTE — Discharge Instructions (Addendum)
-  Bentyl (dicyclomine) as needed for abdominal cramping, up to 2x daily.  -Try stopping the probiotic for a week and see if that helps

## 2021-07-09 LAB — CYTOLOGY - PAP
Chlamydia: NEGATIVE
Comment: NEGATIVE
Comment: NEGATIVE
Comment: NORMAL
Diagnosis: NEGATIVE
Neisseria Gonorrhea: NEGATIVE
Trichomonas: NEGATIVE

## 2021-08-15 ENCOUNTER — Telehealth: Payer: Self-pay

## 2021-08-15 NOTE — Telephone Encounter (Signed)
Patient calls nurse line regarding positive home COVID test. Reports only symptoms being slight congestion. Symptom onset on Sunday, 12/25. Patient states that she would like to schedule follow up appointment as she needs a note for work.   Scheduled patient for next available on Friday morning at 9:10.  ED precautions given.   Veronda Prude, RN

## 2021-08-17 ENCOUNTER — Ambulatory Visit: Payer: Managed Care, Other (non HMO)

## 2021-08-17 NOTE — Progress Notes (Deleted)
° ° °  SUBJECTIVE:   CHIEF COMPLAINT / HPI: COVID  Reports positive home Covid test 12/28. Symptoms started 12/25 including mild congestion.  PERTINENT  PMH / PSH: alcohol use  OBJECTIVE:   There were no vitals taken for this visit.  General: ***, NAD CV: RRR, no murmurs*** Pulm: CTAB, no wheezes or rales  ASSESSMENT/PLAN:   No problem-specific Assessment & Plan notes found for this encounter.     Littie Deeds, MD [facility]   {    This will disappear when note is signed, click to select method of visit    :1}

## 2021-08-17 NOTE — Patient Instructions (Incomplete)
It was nice seeing you today! ° ° ° °Please arrive at least 15 minutes prior to your scheduled appointments. ° °Stay well, °Demond Shallenberger, MD °[facility] °[phone]  °

## 2022-01-22 ENCOUNTER — Encounter: Payer: Self-pay | Admitting: *Deleted

## 2022-03-05 ENCOUNTER — Encounter (HOSPITAL_COMMUNITY): Payer: Self-pay | Admitting: Emergency Medicine

## 2022-03-05 ENCOUNTER — Ambulatory Visit (HOSPITAL_COMMUNITY): Payer: Managed Care, Other (non HMO)

## 2022-03-05 ENCOUNTER — Ambulatory Visit (HOSPITAL_COMMUNITY)
Admission: EM | Admit: 2022-03-05 | Discharge: 2022-03-05 | Disposition: A | Discharge status: Home / Self Care | Payer: Managed Care, Other (non HMO) | Attending: Internal Medicine | Admitting: Internal Medicine

## 2022-03-05 DIAGNOSIS — M25562 Pain in left knee: Secondary | ICD-10-CM | POA: Diagnosis not present

## 2022-03-05 MED ORDER — NAPROXEN 500 MG PO TABS: 500.0000 mg | ORAL_TABLET | Freq: Two times a day (BID) | ORAL | 0 refills | Status: DC

## 2022-03-05 NOTE — ED Provider Notes (Signed)
[facility]    CSN: [id number] Arrival date & time: 03/05/22  1911      History   Chief Complaint Chief Complaint  Patient presents with   Knee Pain    HPI Kelli Parks is a 31 y.o. female.   Patient presents to urgent care for evaluation of her left knee pain that has been ongoing over the last 2 weeks.  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  She has noticed some "cracking" in her left knee over the last couple of weeks. She has been wearing a knee brace to the left knee intermittently with some relief. She has never had this happen in the past and denies known injury or trauma to the left knee. Patient denies pain at time of exam and states that the left knee mainly hurts after she has been on her feet for a long period of time after a long day of work. She has not attempted use of any over the counter medications for her symptoms prior to arrival at urgent care. No other aggravating or relieving factors identified at this time for symptoms.    Knee Pain   Past Medical History:  Diagnosis Date   Allergy    Gastritis    HSV-2 (herpes simplex virus 2) infection     Patient Active Problem List   Diagnosis Date Noted   HSV-2 (herpes simplex virus 2) infection    Healthcare maintenance    Gastritis    Excessive tearing, bilateral    Mood changes    Abnormal uterine bleeding 08/27/2018   Alcohol use 08/27/2018    Past Surgical History:  Procedure Laterality Date   NO PAST SURGERIES      OB History     Gravida  0   Para  0   Term  0   Preterm  0   AB  0   Living  0      SAB  0   IAB  0   Ectopic  0   Multiple  0   Live Births               Home Medications    Prior to Admission medications   Medication Sig Start Date End Date Taking? Authorizing Provider  naproxen (NAPROSYN) 500 MG tablet Take 1 tablet (500 mg total) by mouth 2 (two) times daily.  03/05/22  Yes Carlisle Beers, FNP  dicyclomine (BENTYL) 20 MG tablet Take 1 tablet (20 mg total) by mouth 2 (two) times daily. 07/08/21   Rhys Martini, PA-C    Family History Family History  Problem Relation Age of Onset   Hypertension Mother    Anxiety disorder Mother    Depression Mother    Drug abuse Mother    High blood pressure Father    Alcohol abuse Father    Diabetes Maternal Grandmother    Hypertension Maternal Grandmother     Social History Social History   Tobacco Use   Smoking status: Some Days    Packs/day: 0.00    Types: Cigarettes    Start date: 2009   Smokeless tobacco: Never   Tobacco comments:    4 cigarettes a week. Smokes Black and [employer]s  Vaping Use   Vaping Use: Former  Substance Use Topics   Alcohol use: Not Currently    Alcohol/week: 19.0 standard drinks of alcohol    Types:  1 Glasses of wine, 18 Shots of liquor per week    Comment: 4x a week   Drug use: Yes    Types: Marijuana    Comment: tried cocaine twice 2021. 2 joints a day     Allergies   Patient has no known allergies.   Review of Systems Review of Systems Per HPI  Physical Exam Triage Vital Signs ED Triage Vitals  Enc Vitals Group     BP 03/05/22 1924 127/66     Pulse Rate 03/05/22 1924 71     Resp 03/05/22 1924 15     Temp 03/05/22 1924 98 F (36.7 C)     Temp Source 03/05/22 1924 Oral     SpO2 03/05/22 1924 100 %     Weight --      Height --      Head Circumference --      Peak Flow --      Pain Score 03/05/22 1923 8     Pain Loc --      Pain Edu? --      Excl. in GC? --    No data found.  Updated Vital Signs BP 127/66 (BP Location: Left Arm)   Pulse 71   Temp 98 F (36.7 C) (Oral)   Resp 15   LMP 02/27/2022   SpO2 100%   Visual Acuity Right Eye Distance:   Left Eye Distance:   Bilateral Distance:    Right Eye Near:   Left Eye Near:    Bilateral Near:     Physical Exam Vitals and nursing note reviewed.  Constitutional:       Appearance: Normal appearance. She is not ill-appearing or toxic-appearing.     Comments: Very pleasant patient sitting on exam in position of comfort table in no acute distress.   HENT:     Head: Normocephalic and atraumatic.     Right Ear: Hearing and external ear normal.     Left Ear: Hearing and external ear normal.     Nose: Nose normal.     Mouth/Throat:     Lips: Pink.     Mouth: Mucous membranes are moist.  Eyes:     General: Lids are normal. Vision grossly intact. Gaze aligned appropriately.     Extraocular Movements: Extraocular movements intact.     Conjunctiva/sclera: Conjunctivae normal.  Cardiovascular:     Heart sounds: S1 normal and S2 normal.  Pulmonary:     Effort: Pulmonary effort is normal. No respiratory distress.     Breath sounds: Normal air entry.  Abdominal:     Palpations: Abdomen is soft.  Musculoskeletal:     Cervical back: Neck supple.     Right knee: Normal.     Left knee: Crepitus present. Normal range of motion. No tenderness.     Comments: Left knee: Crepitus present. Full range of motion that is not limited by tenderness. No tenderness to palpation of the left knee present. No ecchymosis, evidence of injury or trauma, or skin color change. No erythema or warmth. +2 dorsalis pedis pulses present. Sensation intact. No obvious deformity or malalignment. No laxity. No swelling.   Skin:    General: Skin is warm and dry.     Capillary Refill: Capillary refill takes less than 2 seconds.     Findings: No rash.  Neurological:     General: No focal deficit present.     Mental Status: She is alert and oriented to person, place, and time. Mental status  is at baseline.     Cranial Nerves: No dysarthria or facial asymmetry.     Gait: Gait is intact.  Psychiatric:        Mood and Affect: Mood normal.        Speech: Speech normal.        Behavior: Behavior normal.        Thought Content: Thought content normal.        Judgment: Judgment normal.      UC  Treatments / Results  Labs (all labs ordered are listed, but only abnormal results are displayed) Labs Reviewed - No data to display  EKG   Radiology No results found.  Procedures Procedures (including critical care time)  Medications Ordered in UC Medications - No data to display  Initial Impression / Assessment and Plan / UC Course  I have reviewed the triage vital signs and the nursing notes.  Pertinent labs & imaging results that were available during my care of the patient were reviewed by me and considered in my medical decision making (see chart for details).   1. Acute pain of the left knee Suspect tendonitis etiology of symptoms. Musculoskeletal exam is unrevealing in clinic other than crepitus with flexion and extension at knee joint. No clinical indication for imaging at this time due to stable musculoskeletal exam.  Patient to continue using knee brace when she is at work to provide support and compression to the knee. She is to use naproxen 500mg  BID for the next couple of days with food to decrease inflammation that could be contributing to tendonitis. Walking referral to orthopedics given. Patient to follow-up with ortho in the next couple of weeks if symptoms do not improve or if symptoms worsen. In the meantime, rest, ice, elevation, and compression advised especially after work to reduce inflammation. Comfortable and supportive footwear recommended. Patient agreeable to this plan.   Discussed physical exam and available lab work findings in clinic with patient.  Counseled patient regarding appropriate use of medications and potential side effects for all medications recommended or prescribed today. Discussed red flag signs and symptoms of worsening condition,when to call the PCP office, return to urgent care, and when to seek higher level of care in the emergency department. Patient verbalizes understanding and agreement with plan. All questions answered. Patient discharged  in stable condition.   Final Clinical Impressions(s) / UC Diagnoses   Final diagnoses:  Acute pain of left knee     Discharge Instructions      Schedule an appointment with Dr. orthopedic. Take naproxen 500mg  at nighttime to reduce swelling and inflammation. Wear knee brace consistently for compression. Rest, ice, and elevate knee.   Follow-up at urgent care as needed.    ED Prescriptions     Medication Sig Dispense Auth. Provider   naproxen (NAPROSYN) 500 MG tablet Take 1 tablet (500 mg total) by mouth 2 (two) times daily. 30 tablet August Saucer, FNP      PDMP not reviewed this encounter.   , Carlisle Beers 03/07/22 [phone]

## 2022-03-05 NOTE — ED Triage Notes (Signed)
For about 2 weeks having left knee pain, feels crunching when moves it. Sometimes feels weak. Denies falls or injuries. More painful with weight bearing

## 2022-03-05 NOTE — Discharge Instructions (Signed)
Schedule an appointment with Dr. August Saucer orthopedic. Take naproxen 500mg  at nighttime to reduce swelling and inflammation. Wear knee brace consistently for compression. Rest, ice, and elevate knee.   Follow-up at urgent care as needed.

## 2022-03-11 ENCOUNTER — Ambulatory Visit: Payer: Managed Care, Other (non HMO) | Admitting: Family Medicine

## 2022-03-22 ENCOUNTER — Ambulatory Visit: Payer: Managed Care, Other (non HMO) | Admitting: Orthopedic Surgery

## 2023-01-17 ENCOUNTER — Ambulatory Visit (INDEPENDENT_AMBULATORY_CARE_PROVIDER_SITE_OTHER): Payer: Managed Care, Other (non HMO) | Admitting: Student

## 2023-01-17 ENCOUNTER — Other Ambulatory Visit (HOSPITAL_COMMUNITY)
Admission: RE | Admit: 2023-01-17 | Discharge: 2023-01-17 | Disposition: A | Discharge status: Home / Self Care | Payer: Managed Care, Other (non HMO) | Source: Ambulatory Visit | Attending: Family Medicine | Admitting: Family Medicine

## 2023-01-17 ENCOUNTER — Encounter: Payer: Self-pay | Admitting: Student

## 2023-01-17 VITALS — BP 134/77 | HR 89 | Ht 63.5 in | Wt 164.0 lb

## 2023-01-17 DIAGNOSIS — Z113 Encounter for screening for infections with a predominantly sexual mode of transmission: Secondary | ICD-10-CM

## 2023-01-17 DIAGNOSIS — R4589 Other symptoms and signs involving emotional state: Secondary | ICD-10-CM

## 2023-01-17 LAB — POCT WET PREP (WET MOUNT)
Clue Cells Wet Prep Whiff POC: POSITIVE
Trichomonas Wet Prep HPF POC: ABSENT

## 2023-01-17 MED ORDER — ARIPIPRAZOLE 5 MG PO TABS
5.0000 mg | ORAL_TABLET | Freq: Every day | ORAL | 0 refills | Status: DC
Start: 2023-01-17 — End: 2023-08-04

## 2023-01-17 NOTE — Assessment & Plan Note (Addendum)
Patient has been having depressed mood for a while but worsening over the past month.  Has been having some risky behaviors which she discusses with me in office today.  There is some concern for bipolar disorder given this with possible hypomania currently.  Never been on an SSRI before.  She is open to trying medication today and psychiatry referral as well as therapy resources. -Start Abilify 5 mg daily -Psychiatry referral placed -Therapy resources discussed-told her to contact her insurance to get in network providers -2 weeks follow-up

## 2023-01-17 NOTE — Progress Notes (Signed)
SUBJECTIVE:   CHIEF COMPLAINT / HPI:   Vaginal Discharge: Patient is a 32 y.o. female presenting with interest in screening for sexually transmitted infections today.  She has had multiple new partners.  Last unprotected intercourse was last week.  She has contraception with  None-she declines starting any today. She does not use barrier method consistently.  Mood: After returning to room patient is very tearful and emotional.  She relays to me that it was not just 1 person that she has had unprotected intercourse with. Has had depression and anxiety for awhile but not She states that she has had multiple partners recently and that he does not feel like herself.  She says that she is very tearful and emotional when she is at work.  She denies any SI/HI.  She feels that there is a lot of life changes.  She states that she does not have any 24 hour periods without sleep. She has had some rapid speech. Has been drinking more than usual (did not get into how much but has had abuse in past). some increase in risky behaviors. Never been on SSRI before.    01/17/2023   11:06 AM 07/03/2021    4:14 PM 06/06/2021    3:39 PM 09/25/2018    2:07 PM 08/27/2018   10:13 AM  Depression screen PHQ 2/9  Decreased Interest 2 1 1 1 3   Down, Depressed, Hopeless 3 1 1 1 2   PHQ - 2 Score 5 2 2 2 5   Altered sleeping 2 2 3  0 1  Tired, decreased energy 3 1 1 1 2   Change in appetite 2 0 0 0 2  Feeling bad or failure about yourself  1 0 1 0 1  Trouble concentrating 1 2 1  0 0  Moving slowly or fidgety/restless 2 1 2  0 0  Suicidal thoughts 0 0 0 0 0  PHQ-9 Score 16 8 10 3 11   Difficult doing work/chores  Not difficult at all  Not difficult at all Somewhat difficult     PERTINENT  PMH / PSH: alcohol use, HSV-2  OBJECTIVE:   BP 134/77   Pulse 89   Ht 5' 3.5" (1.613 m)   Wt 164 lb (74.4 kg)   LMP 12/30/2022   SpO2 99%   BMI 28.60 kg/m    General: NAD , able to participate in exam Respiratory: Normal  effort, no obvious respiratory distress Pelvic: VULVA: normal appearing vulva with no masses, tenderness or lesions, VAGINA: Normal appearing vagina with normal color, no lesions, with white discharge present, CERVIX: No lesions, white discharge present Psych: (after vaginal exam)-very tearful, does not appear to be responding to internal stimuli Chaperone Cleatrice Burke CMA present for pelvic exam  ASSESSMENT/PLAN:   Depressed mood Patient has been having depressed mood for a while but worsening over the past month.  Has been having some risky behaviors which she discusses with me in office today.  There is some concern for bipolar disorder given this with possible hypomania currently.  Never been on an SSRI before.  She is open to trying medication today and psychiatry referral as well as therapy resources. -Start Abilify 5 mg daily -Psychiatry referral placed -Therapy resources discussed-told her to contact her insurance to get in network providers -2 weeks follow-up  32 y.o. female with no vaginal symptoms but some increase in unprotected sex with multiple partners. Wet prep performed today shows clue cells and positive whiff consistent with BV however asymptomatic.  Patient  is interested in STI screening.   Plan: -Wet prep as above, will advise holding off on treating given asymptomatic -Discussed protection during intercourse and contraceptive methods -Could consider PREP initiation in future  -Follow-up as needed  Levin Erp, MD [facility]

## 2023-01-17 NOTE — Patient Instructions (Addendum)
It was great to see you! Thank you for allowing me to participate in your care!   Our plans for today:  - I will let you know your wet prep results today - Your other results will take within around 3 days to come back  Take care and seek immediate care sooner if you develop any concerns.  Levin Erp, MD   Therapy and Counseling Resources Patients with commercial insurance or Medicare should contact their insurance company to get a list of in network providers.  Open Access/Walk In [facility]  [address] [place], [place] [phone] Crisis [phone]  [facility] [place],  (Spanish)   [address], [place]: [phone]) 8:30 - 12; 1 - 2:30  [facility] [employer],  [address] [place], [place]    [phone]):8:30 - 12; 2 - 3PM  [facility] [employer],  [address]  [place]; [phone]):   Mon - Fri 8 AM - 5 PM  [facility] [address] [place]  MWF 12:30 to 3:00 or call to schedule an appointment  [phone]  Specific Provider options Psychology Today  [email or web address] click on find a therapist  enter your zip code left side and select or tailor a therapist for your specific need.   [facility] Provider Directory [email or web address]  (Medicaid)   Follow all drop down to find a provider  Social Support program [facility] [place] [phone] or [employer] [address], [place], [place] Recovery support and educational   24- Hour Availability:   [facility]  [address] [place], [place] [phone] Crisis [phone]  [facility] [phone]  [facility]  [phone]   [facility]  [phone] (after hours)  Therapeutic Alternative/Mobile Crisis    [phone]  [place] National Suicide Hotline  [phone] Len Childs)  Call 911 or go to emergency room  [facility]  [phone]);  [place] and [employer]  [phone]); [place], [place], [place], [place], [place], [place], [place]

## 2023-01-18 LAB — HIV ANTIBODY (ROUTINE TESTING W REFLEX): HIV Screen 4th Generation wRfx: NONREACTIVE

## 2023-01-18 LAB — RPR: RPR Ser Ql: NONREACTIVE

## 2023-01-20 LAB — CERVICOVAGINAL ANCILLARY ONLY
Chlamydia: NEGATIVE
Comment: NEGATIVE
Comment: NORMAL
Neisseria Gonorrhea: NEGATIVE

## 2023-03-25 ENCOUNTER — Ambulatory Visit (HOSPITAL_COMMUNITY): Payer: Self-pay | Admitting: Psychiatry

## 2023-04-30 ENCOUNTER — Encounter (HOSPITAL_COMMUNITY): Payer: Self-pay | Admitting: Psychiatry

## 2023-06-30 ENCOUNTER — Encounter (HOSPITAL_COMMUNITY): Payer: Self-pay | Admitting: Psychiatry

## 2023-06-30 ENCOUNTER — Ambulatory Visit (HOSPITAL_BASED_OUTPATIENT_CLINIC_OR_DEPARTMENT_OTHER): Payer: 59 | Admitting: Psychiatry

## 2023-06-30 ENCOUNTER — Other Ambulatory Visit: Payer: Self-pay

## 2023-06-30 VITALS — BP 148/91 | HR 57 | Ht 63.0 in | Wt 170.0 lb

## 2023-06-30 DIAGNOSIS — F1920 Other psychoactive substance dependence, uncomplicated: Secondary | ICD-10-CM | POA: Diagnosis not present

## 2023-06-30 DIAGNOSIS — F319 Bipolar disorder, unspecified: Secondary | ICD-10-CM

## 2023-06-30 MED ORDER — QUETIAPINE FUMARATE 25 MG PO TABS
25.0000 mg | ORAL_TABLET | Freq: Two times a day (BID) | ORAL | 1 refills | Status: DC
Start: 2023-06-30 — End: 2023-08-04

## 2023-06-30 NOTE — Progress Notes (Signed)
Psychiatric Initial Adult Assessment   Patient location; office  Provider location; office  Patient Identification: Kelli Parks MRN:  [id number] Date of Evaluation:  06/30/2023 Referral Source: Primary care Chief Complaint:   Chief Complaint  Patient presents with   Establish Care   Drug Problem   Visit Diagnosis:    ICD-10-CM   1. Polysubstance dependence (HCC)  F19.20 QUEtiapine (SEROQUEL) 25 MG tablet    2. Bipolar I disorder (HCC)  F31.9 QUEtiapine (SEROQUEL) 25 MG tablet      History of Present Illness: Patient is a 32 year old African-American, single, employed female who is referred from primary care for the management of her psychiatric symptoms.  Patient reported depression, anxiety, crying spells and feels sometimes lonely.  She reported symptoms are there for a while but started to notice more intense lately.  She also reported heavy drinking, history of using cocaine, cannabis use but lately had cut down from the past.  She had a break-up in 2018 after 6 years of relationship.  Patient reported it was very disturbing because he cheated on her.  Patient told they were together when she was 32 year old and she went to [place] with him and stayed there for a few years.  Patient told when she came to [place] and went back she find out that he physically cheated with someone and she decided to leave him.  Patient told since then her drug use is more frequent and heavy.  Last year she has a DUI but with the help of lawyer she was able to get her license back.  Patient reported she still uses cocaine, alcohol and marijuana but use is cut down from the past.  Her last cocaine use was 4 weeks ago.  She smoked marijuana and alcohol yesterday.  She reported sometimes feeling very lonely and sad.  She reported irritability, frustration, anger, mood swings.  She admitted very short temper and make impulsive decision.  She has road rage and excessive by.  She admitted making  irresponsible decisions about her life.  She has little financial debt.  She had multiple partners and recently given the diagnosis of HSV.  She denies any hallucination or paranoia but admitted sometimes talk to herself.  She lived by herself.  She has good support from her mother, aunt and her father.  Her parents are separated.  She sleeps on and off.  She reported there are times when she feels very sad at men regret about her previous decisions but no suicidal thoughts or homicidal thoughts.  Her appetite is fair.  She gained few pounds.  Her PCP started her on Abilify in May but she has not picked up.  She also not seeing any therapist but like to see a therapist to help her coping skills.  She is also open to try medication that can help her anxiety.  In the past she has taken Xanax from the streets.  No history of seizures, blackouts, history of head trauma.  Patient is single and currently not in any relationship which is serious.  She has no children.  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  She reported at her job people do notice that she is hypertalkative.  Associated Signs/Symptoms: Depression Symptoms:  depressed mood, feelings of worthlessness/guilt, difficulty concentrating, hopelessness, anxiety, disturbed sleep, weight loss, (Hypo) Manic Symptoms:  Elevated Mood, Flight of Ideas, Licensed conveyancer, Impulsivity, Irritable Mood, Labiality of Mood, Anxiety Symptoms:  Excessive Worry, Psychotic Symptoms:  none reported PTSD Symptoms: Negative  Past Psychiatric History: No history of suicidal attempt.  History of heavy drinking, cocaine, cannabis use.  History of DUI in 2023.  History of mood swing, anger, highs and lows, road rage, excessive buying and making impulsive decisions.  PCP prescribed Abilify but never picked up from the pharmacy.  Never seen a psychiatrist or therapist.    Previous Psychotropic Medications: No   Substance  Abuse History in the last 12 months:  Yes.    Consequences of Substance Abuse: Legal Consequences:  DUI in 2023  Past Medical History:  Past Medical History:  Diagnosis Date   Allergy    Gastritis    HSV-2 (herpes simplex virus 2) infection     Past Surgical History:  Procedure Laterality Date   NO PAST SURGERIES      Family Psychiatric History: Mother has bipolar disorder  Family History:  Family History  Problem Relation Age of Onset   Hypertension Mother    Anxiety disorder Mother    Depression Mother    Drug abuse Mother    High blood pressure Father    Alcohol abuse Father    Diabetes Maternal Grandmother    Hypertension Maternal Grandmother     Social History:   Social History   Socioeconomic History   Marital status: Single    Spouse name: Not on file   Number of children: 0   Years of education: 13.5   Highest education level: Some college, no degree  Occupational History   Occupation: English as a second language teacher  Tobacco Use   Smoking status: Some Days    Current packs/day: 0.00    Types: Cigarettes    Last attempt to quit: 2009    Years since quitting: 15.8   Smokeless tobacco: Never   Tobacco comments:    4 cigarettes a week. Smokes Black and Milds  Vaping Use   Vaping status: Former  Substance and Sexual Activity   Alcohol use: Yes    Alcohol/week: 19.0 standard drinks of alcohol    Types: 1 Glasses of wine, 18 Shots of liquor per week    Comment: 4x a week   Drug use: Yes    Types: Marijuana    Comment: tried cocaine twice 2021. 2 joints a day   Sexual activity: Yes    Partners: Male    Comment: 2 partners, uses protection every time, no BC, wants children but not right now  Other Topics Concern   Not on file  Social History Narrative   Not on file   Social Determinants of Health   Financial Resource Strain: Not on file  Food Insecurity: Not on file  Transportation Needs: Not on file  Physical Activity: Not on file  Stress:  Not on file  Social Connections: Not on file    Additional Social History: Patient born and raised in [place].  Started relationship at early age and did not finish college.  After 6 years of relationship it was ended due to boyfriend cheating.  Has been with multiple partners since then.  She lives single and by herself.  No children.  Parents are separated.  Patient is close to her mother but also talk to her father on a regular basis.  She has a good social network.  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  Allergies:  No Known Allergies  Metabolic Disorder Labs: Lab Results  Component Value Date   HGBA1C 5.3 07/03/2021  No results found for: "PROLACTIN" Lab Results  Component Value Date   CHOL 147 07/03/2021   TRIG 43 07/03/2021   HDL 79 07/03/2021   CHOLHDL 1.9 07/03/2021   LDLCALC 58 07/03/2021   Lab Results  Component Value Date   TSH 1.130 08/27/2018    Therapeutic Level Labs: No results found for: "LITHIUM" No results found for: "CBMZ" No results found for: "VALPROATE"  Current Medications: Current Outpatient Medications  Medication Sig Dispense Refill   ARIPiprazole (ABILIFY) 5 MG tablet Take 1 tablet (5 mg total) by mouth daily. (Patient not taking: Reported on 06/30/2023) 30 tablet 0   dicyclomine (BENTYL) 20 MG tablet Take 1 tablet (20 mg total) by mouth 2 (two) times daily. (Patient not taking: Reported on 06/30/2023) 22 tablet 0   naproxen (NAPROSYN) 500 MG tablet Take 1 tablet (500 mg total) by mouth 2 (two) times daily. (Patient not taking: Reported on 06/30/2023) 30 tablet 0   No current facility-administered medications for this visit.    Musculoskeletal: Strength & Muscle Tone: within normal limits Gait & Station: normal Patient leans: N/A  Psychiatric Specialty Exam: Review of Systems  Blood pressure (!) 148/91, pulse (!) 57, height 5\' 3"  (1.6 m), weight 170 lb (77.1 kg).Body mass index is 30.11 kg/m.   General Appearance: Casual  Eye Contact:  Good  Speech:   fast  Volume:  Increased  Mood:  Anxious, Dysphoric, and emotional  Affect:  Labile and Full Range  Thought Process:  Descriptions of Associations: Intact  Orientation:  Full (Time, Place, and Person)  Thought Content:  Rumination  Suicidal Thoughts:  No  Homicidal Thoughts:  No  Memory:  Immediate;   Good Recent;   Good Remote;   Good  Judgement:  Fair  Insight:  Shallow  Psychomotor Activity:  Increased  Concentration:  Concentration: Fair and Attention Span: Fair  Recall:  Good  Fund of Knowledge:Good  Language: Good  Akathisia:  No  Handed:  Right  AIMS (if indicated):  not done  Assets:  Communication Skills Desire for Improvement Housing Talents/Skills Transportation  ADL's:  Intact  Cognition: WNL  Sleep:  Fair   Screenings: PHQ2-9    Flowsheet Row Office Visit from 01/17/2023 in [facility] - [facility] Dept Of [place]. [facility] Office Visit from 07/03/2021 in [facility] - [facility] Dept Of [place]. [facility] Office Visit from 06/06/2021 in [facility] - [facility] [place]. [facility] Office Visit from 09/25/2018 in [facility] - [facility] [place]. [facility] Office Visit from 08/27/2018 in [facility] - [facility] [place]. [facility]  PHQ-2 Total Score 5 2 2 2 5   PHQ-9 Total Score 16 8 10 3 11       Flowsheet Row ED from 07/08/2021 in [facility] ED from 02/15/2021 in [facility] at [facility] RISK CATEGORY Error: Question 1 not populated No Risk       Assessment and Plan: Patient is a 32 year old African-American, employed, single female with history of polysubstance use, mood symptoms.  Never seen psychiatrist or therapist.  PCP prescribed Abilify but never picked up.  Discussed possible mood symptoms with increased risk due to making  impulsive decision.  Discussed and address current drug use.  Patient is sniffing cocaine, last time 4 weeks ago.  She is  still drinks alcohol and uses cannabis on a regular basis.  Maximum sobriety from alcohol was 6 days but she was using cannabis at that time.  Discussed to try Seroquel 25 -50 mg at bedtime to help her mood symptoms.  Referred to see a therapist and also discussed about CDIOP.  Patient will look into it and give [place] a call back if she interested with CDIOP program.  Discussed safety concerns and any time having active suicidal thoughts or homicidal thought then she need to call 911 or go to local emergency room.  Discussed medication side effects in detail.  Follow-up in 4 to 6 weeks.  Patient preferred her that her information should not be shared with other providers.  Collaboration of Care: Other provider involved in patient's care AEB notes are available in epic to review  Patient/Guardian was advised Release of Information must be obtained prior to any record release in order to collaborate their care with an outside provider. Patient/Guardian was advised if they have not already done so to contact the registration department to sign all necessary forms in order for [place] to release information regarding their care.   Consent: Patient/Guardian gives verbal consent for treatment and assignment of benefits for services provided during this visit. Patient/Guardian expressed understanding and agreed to proceed.   I spent 68 time face-to-face with the patient during this encounter.  Cleotis Nipper, MD 11/11/20249:07 AM

## 2023-07-30 NOTE — Progress Notes (Signed)
Reschedule

## 2023-08-04 ENCOUNTER — Telehealth (HOSPITAL_COMMUNITY): Payer: 59 | Admitting: Psychiatry

## 2023-08-04 ENCOUNTER — Encounter (HOSPITAL_COMMUNITY): Payer: Self-pay | Admitting: Psychiatry

## 2023-08-04 VITALS — Wt 170.0 lb

## 2023-08-04 DIAGNOSIS — F1920 Other psychoactive substance dependence, uncomplicated: Secondary | ICD-10-CM

## 2023-08-04 DIAGNOSIS — F319 Bipolar disorder, unspecified: Secondary | ICD-10-CM

## 2023-08-04 MED ORDER — QUETIAPINE FUMARATE 25 MG PO TABS
25.0000 mg | ORAL_TABLET | Freq: Two times a day (BID) | ORAL | 0 refills | Status: DC
Start: 2023-08-04 — End: 2024-06-08

## 2023-08-04 NOTE — Progress Notes (Signed)
[facility] MD Virtual Progress Note   Patient Location: Work Provider Location: Home Office  I connect with patient by telephone and verified that I am speaking with correct person by using two identifiers. I discussed the limitations of evaluation and management by telemedicine and the availability of in person appointments. I also discussed with the patient that there may be a patient responsible charge related to this service. The patient expressed understanding and agreed to proceed.  Kelli Parks [id number] 32 y.o.  08/04/2023 2:34 PM  History of Present Illness:  Patient is a 32 year old African-American single, employed female who was seen first time 6 weeks ago.  Today patient forgot her appointment but able to talk briefly on the phone.  She apologized not to be on video platform.  She reported taking the Seroquel 25 mg at bedtime it is helping her mood, irritability and sleep.  She noticed her coworker complement her mood but she is much calmer.  However she is struggle with attention, focus and sometimes distracted.  She cut down her drinking and cannabis use.  She is no longer using cocaine.  We have recommended CDIOP but patient feels that she can stop the drugs on her own.  Patient works at [employer].  Her job is busy but manageable.  Patient had break-up in 2018 after 6 years of relationship.  She admitted since then her mental health has been deteriorated and she is using more drugs, increase drinking and had a DUI in 2023.  Since taking the Seroquel 25 mg or impulsive behavior has been reduced.  She is only taking 25 mg and hoping to increase the dose eventually to help her residual mood lability.  She has no tremors, shakes or any EPS.  She is single and lives by herself. She had a good support from her mother, aunt and her father.  Her parents are separated.  She denies any hallucination, paranoia or any suicidal thoughts or homicidal thoughts.  She  regrets about her previous impulsive decision.  Past Psychiatric History: History of drinking and using cocaine, cannabis use.  History of DUI in 2023.  History of mood swing, anger, highs and lows, road rage, excessive buying and making impulsive decisions.  No history of suicidal attempt, inpatient treatment.  PCP prescribed Abilify but never picked up from the pharmacy.   Outpatient Encounter Medications as of 08/04/2023  Medication Sig   dicyclomine (BENTYL) 20 MG tablet Take 1 tablet (20 mg total) by mouth 2 (two) times daily. (Patient not taking: Reported on 06/30/2023)   naproxen (NAPROSYN) 500 MG tablet Take 1 tablet (500 mg total) by mouth 2 (two) times daily. (Patient not taking: Reported on 06/30/2023)   QUEtiapine (SEROQUEL) 25 MG tablet Take 1-2 tablets (25-50 mg total) by mouth 2 (two) times daily.   [DISCONTINUED] ARIPiprazole (ABILIFY) 5 MG tablet Take 1 tablet (5 mg total) by mouth daily. (Patient not taking: Reported on 06/30/2023)   [DISCONTINUED] QUEtiapine (SEROQUEL) 25 MG tablet Take 1-2 tablets (25-50 mg total) by mouth 2 (two) times daily.   No facility-administered encounter medications on file as of 08/04/2023.    No results found for this or any previous visit (from the past 2160 hours).   Psychiatric Specialty Exam: Physical Exam  Review of Systems  Weight 170 lb (77.1 kg).There is no height or weight on file to calculate BMI.  General Appearance: NA  Eye Contact:  NA  Speech:   fast  Volume:  Increased  Mood:  Anxious  Affect:  Labile  Thought Process:  Descriptions of Associations: Intact  Orientation:  Full (Time, Place, and Person)  Thought Content:  Rumination  Suicidal Thoughts:  No  Homicidal Thoughts:  No  Memory:  Immediate;   Good Recent;   Good Remote;   Fair  Judgement:  Intact  Insight:  Shallow  Psychomotor Activity:  Increased  Concentration:  Concentration: Fair and Attention Span: Fair  Recall:  Good  Fund of Knowledge:  Good   Language:  Good  Akathisia:  No  Handed:  Right  AIMS (if indicated):     Assets:  Communication Skills Desire for Improvement Housing Social Support Talents/Skills Transportation  ADL's:  Intact  Cognition:  WNL  Sleep:  sleepy     Assessment/Plan: Bipolar I disorder (HCC) - Plan: QUEtiapine (SEROQUEL) 25 MG tablet  Polysubstance dependence (HCC) - Plan: QUEtiapine (SEROQUEL) 25 MG tablet  Discussed current medication which is helping her mood and sleep.  She agreed to continue Seroquel 25 mg up to 2 pills at night to help her residual mood lability.  She like to have another appointment in 6 weeks.  Discussed medication side effects and benefits.  Recommend to call us back if you have any question or any concern.  Follow-up and 6 weeks.  Follow Up Instructions:     I discussed the assessment and treatment plan with the patient. The patient was provided an opportunity to ask questions and all were answered. The patient agreed with the plan and demonstrated an understanding of the instructions.   The patient was advised to call back or seek an in-person evaluation if the symptoms worsen or if the condition fails to improve as anticipated.    Collaboration of Care: Other provider involved in patient's care AEB notes are available in epic to review  Patient/Guardian was advised Release of Information must be obtained prior to any record release in order to collaborate their care with an outside provider. Patient/Guardian was advised if they have not already done so to contact the registration department to sign all necessary forms in order for [place] to release information regarding their care.   Consent: Patient/Guardian gives verbal consent for treatment and assignment of benefits for services provided during this visit. Patient/Guardian expressed understanding and agreed to proceed.     I provided 18 minutes of non face to face time during this encounter.  Note: This document  was prepared by [employer] voice dictation technology and any errors that results from this process are unintentional.    Cleotis Nipper, MD 08/04/2023

## 2023-08-29 ENCOUNTER — Encounter (HOSPITAL_COMMUNITY): Payer: Self-pay

## 2023-08-29 ENCOUNTER — Ambulatory Visit (HOSPITAL_COMMUNITY)
Admission: EM | Admit: 2023-08-29 | Discharge: 2023-08-29 | Disposition: A | Discharge status: Home / Self Care | Payer: Managed Care, Other (non HMO) | Attending: Family Medicine | Admitting: Family Medicine

## 2023-08-29 ENCOUNTER — Ambulatory Visit (INDEPENDENT_AMBULATORY_CARE_PROVIDER_SITE_OTHER): Payer: Managed Care, Other (non HMO)

## 2023-08-29 DIAGNOSIS — M25562 Pain in left knee: Secondary | ICD-10-CM

## 2023-08-29 MED ORDER — NAPROXEN 500 MG PO TABS: 500.0000 mg | ORAL_TABLET | Freq: Two times a day (BID) | ORAL | 0 refills | Status: AC

## 2023-08-29 MED ORDER — KETOROLAC TROMETHAMINE 30 MG/ML IJ SOLN
30.0000 mg | Freq: Once | INTRAMUSCULAR | Status: AC
  Administered 2023-08-29: 30 mg via INTRAMUSCULAR

## 2023-08-29 MED ORDER — KETOROLAC TROMETHAMINE 30 MG/ML IJ SOLN
INTRAMUSCULAR | Status: AC
  Filled 2023-08-29: qty 1

## 2023-08-29 NOTE — ED Triage Notes (Signed)
 Patient presents to the office for left knee pain. Patient reports her left knee pops. Patient states her knee has been hurting for years.  Pain 08/10

## 2023-08-29 NOTE — Discharge Instructions (Addendum)
 You were seen today for knee pain.  Your xray appears normal today.  I have given you a knee brace, and sent out an anti-inflammatory to use.  I recommend you use ice as needed.  You should follow up with your primary care provider for further workup and treatment if not improving.

## 2023-08-29 NOTE — ED Provider Notes (Signed)
 [facility]    CSN: [id number] Arrival date & time: 08/29/23  1058      History   Chief Complaint Chief Complaint  Patient presents with   Knee Pain    HPI Kelli Parks is a 33 y.o. female.    Knee Pain  She is here for knee pain.  She has felt knee grinding for a long time.  Yesterday she was walking up stairs at work, and she had pain at the knee.  Felt like it was buckling.  Pain with certain movements, particularly bending of the knee.  She has pain primarily at the medial aspect of the knee when she bends it.  No swelling at this time.  She did have swelling last year.        Past Medical History:  Diagnosis Date   Allergy    Gastritis    HSV-2 (herpes simplex virus 2) infection     Patient Active Problem List   Diagnosis Date Noted   Depressed mood 01/17/2023   HSV-2 (herpes simplex virus 2) infection    Healthcare maintenance    Gastritis    Excessive tearing, bilateral    Abnormal uterine bleeding 08/27/2018   Alcohol use 08/27/2018    Past Surgical History:  Procedure Laterality Date   NO PAST SURGERIES      OB History     Gravida  0   Para  0   Term  0   Preterm  0   AB  0   Living  0      SAB  0   IAB  0   Ectopic  0   Multiple  0   Live Births               Home Medications    Prior to Admission medications   Medication Sig Start Date End Date Taking? Authorizing Provider  QUEtiapine  (SEROQUEL ) 25 MG tablet Take 1-2 tablets (25-50 mg total) by mouth 2 (two) times daily. 08/04/23 09/03/23 Yes Arfeen, Leni DASEN, MD  dicyclomine  (BENTYL ) 20 MG tablet Take 1 tablet (20 mg total) by mouth 2 (two) times daily. Patient not taking: Reported on 06/30/2023 07/08/21   Graham, Laura E, PA-C  naproxen  (NAPROSYN ) 500 MG tablet Take 1 tablet (500 mg total) by mouth 2 (two) times daily. Patient not taking: Reported on 06/30/2023 03/05/22   Enedelia Dorna HERO, FNP    Family History Family History  Problem  Relation Age of Onset   Hypertension Mother    Anxiety disorder Mother    Depression Mother    Drug abuse Mother    High blood pressure Father    Alcohol abuse Father    Diabetes Maternal Grandmother    Hypertension Maternal Grandmother     Social History Social History   Tobacco Use   Smoking status: Some Days    Current packs/day: 0.00    Types: Cigarettes    Last attempt to quit: 2009    Years since quitting: 16.0   Smokeless tobacco: Never   Tobacco comments:    4 cigarettes a week. Smokes Black and Milds  Vaping Use   Vaping status: Former  Substance Use Topics   Alcohol use: Yes    Alcohol/week: 19.0 standard drinks of alcohol    Types: 1 Glasses of wine, 18 Shots of liquor per week    Comment: 4x a week   Drug use: Yes    Types: Marijuana    Comment: tried  cocaine twice 2021. 2 joints a day     Allergies   Patient has no known allergies.   Review of Systems Review of Systems  Constitutional: Negative.   HENT: Negative.    Respiratory: Negative.    Cardiovascular: Negative.   Gastrointestinal: Negative.   Genitourinary: Negative.      Physical Exam Triage Vital Signs ED Triage Vitals  Encounter Vitals Group     BP 08/29/23 1143 131/78     Systolic BP Percentile --      Diastolic BP Percentile --      Pulse Rate 08/29/23 1143 72     Resp 08/29/23 1143 16     Temp 08/29/23 1143 98.4 F (36.9 C)     Temp Source 08/29/23 1143 Oral     SpO2 08/29/23 1143 98 %     Weight --      Height --      Head Circumference --      Peak Flow --      Pain Score 08/29/23 1145 8     Pain Loc --      Pain Education --      Exclude from Growth Chart --    No data found.  Updated Vital Signs BP 131/78 (BP Location: Left Arm)   Pulse 72   Temp 98.4 F (36.9 C) (Oral)   Resp 16   LMP 07/30/2023 (Approximate)   SpO2 98%   Visual Acuity Right Eye Distance:   Left Eye Distance:   Bilateral Distance:    Right Eye Near:   Left Eye Near:    Bilateral  Near:     Physical Exam Constitutional:      Appearance: Normal appearance.  Musculoskeletal:     Comments: No swelling or deformity to the left knee;  No TTP to the knee initially.  Full rom without pain/limitation.  Once standing, she bent the knee and had pain at the medial aspect of the knee  Neurological:     General: No focal deficit present.     Mental Status: She is alert.  Psychiatric:        Mood and Affect: Mood normal.      UC Treatments / Results  Labs (all labs ordered are listed, but only abnormal results are displayed) Labs Reviewed - No data to display  EKG   Radiology No results found.  Procedures Procedures (including critical care time)  Medications Ordered in UC Medications  ketorolac  (TORADOL ) 30 MG/ML injection 30 mg (30 mg Intramuscular Given 08/29/23 1203)    Initial Impression / Assessment and Plan / UC Course  I have reviewed the triage vital signs and the nursing notes.  Pertinent labs & imaging results that were available during my care of the patient were reviewed by me and considered in my medical decision making (see chart for details).   Final Clinical Impressions(s) / UC Diagnoses   Final diagnoses:  Acute pain of left knee     Discharge Instructions      You were seen today for knee pain.  Your xray appears normal today.  I have given you a knee brace, and sent out an anti-inflammatory to use.  I recommend you use ice as needed.  You should follow up with your primary care provider for further workup and treatment if not improving.     ED Prescriptions     Medication Sig Dispense Auth. Provider   naproxen  (NAPROSYN ) 500 MG tablet Take 1 tablet (500 mg  total) by mouth 2 (two) times daily. 30 tablet Darral Longs, MD      PDMP not reviewed this encounter.   Darral Longs, MD 08/29/23 1218

## 2023-09-04 ENCOUNTER — Ambulatory Visit (HOSPITAL_COMMUNITY): Payer: 59 | Admitting: Clinical

## 2023-12-23 ENCOUNTER — Emergency Department (HOSPITAL_COMMUNITY)

## 2023-12-23 ENCOUNTER — Other Ambulatory Visit: Payer: Self-pay

## 2023-12-23 ENCOUNTER — Emergency Department (HOSPITAL_COMMUNITY)
Admission: EM | Admit: 2023-12-23 | Discharge: 2023-12-24 | Disposition: A | Discharge status: Home / Self Care | Attending: Emergency Medicine | Admitting: Emergency Medicine

## 2023-12-23 ENCOUNTER — Encounter (HOSPITAL_COMMUNITY): Payer: Self-pay | Admitting: Emergency Medicine

## 2023-12-23 DIAGNOSIS — R1084 Generalized abdominal pain: Secondary | ICD-10-CM | POA: Insufficient documentation

## 2023-12-23 DIAGNOSIS — R112 Nausea with vomiting, unspecified: Secondary | ICD-10-CM | POA: Insufficient documentation

## 2023-12-23 DIAGNOSIS — R0789 Other chest pain: Secondary | ICD-10-CM | POA: Diagnosis not present

## 2023-12-23 LAB — CBC
HCT: 36.5 % (ref 36.0–46.0)
Hemoglobin: 11.9 g/dL — ABNORMAL LOW (ref 12.0–15.0)
MCH: 28.8 pg (ref 26.0–34.0)
MCHC: 32.6 g/dL (ref 30.0–36.0)
MCV: 88.4 fL (ref 80.0–100.0)
Platelets: 277 10*3/uL (ref 150–400)
RBC: 4.13 MIL/uL (ref 3.87–5.11)
RDW: 13.3 % (ref 11.5–15.5)
WBC: 8.3 10*3/uL (ref 4.0–10.5)
nRBC: 0 % (ref 0.0–0.2)

## 2023-12-23 LAB — COMPREHENSIVE METABOLIC PANEL WITH GFR
ALT: 21 U/L (ref 0–44)
AST: 23 U/L (ref 15–41)
Albumin: 4.3 g/dL (ref 3.5–5.0)
Alkaline Phosphatase: 40 U/L (ref 38–126)
Anion gap: 10 (ref 5–15)
BUN: 11 mg/dL (ref 6–20)
CO2: 21 mmol/L — ABNORMAL LOW (ref 22–32)
Calcium: 8.9 mg/dL (ref 8.9–10.3)
Chloride: 104 mmol/L (ref 98–111)
Creatinine, Ser: 0.61 mg/dL (ref 0.44–1.00)
GFR, Estimated: >60 mL/min (ref >60)
Glucose, Bld: 114 mg/dL — ABNORMAL HIGH (ref 70–99)
Potassium: 3.8 mmol/L (ref 3.5–5.1)
Sodium: 135 mmol/L (ref 135–145)
Total Bilirubin: 0.6 mg/dL (ref 0.0–1.2)
Total Protein: 7.8 g/dL (ref 6.5–8.1)

## 2023-12-23 LAB — HCG, SERUM, QUALITATIVE: Preg, Serum: NEGATIVE

## 2023-12-23 LAB — LIPASE, BLOOD: Lipase: 23 U/L (ref 11–51)

## 2023-12-23 MED ORDER — MORPHINE SULFATE (PF) 4 MG/ML IV SOLN
4.0000 mg | Freq: Once | INTRAVENOUS | Status: AC
  Administered 2023-12-24: 4 mg via INTRAVENOUS
  Filled 2023-12-23: qty 1

## 2023-12-23 MED ORDER — ONDANSETRON HCL 4 MG/2ML IJ SOLN
4.0000 mg | Freq: Once | INTRAMUSCULAR | Status: AC
  Administered 2023-12-23: 4 mg via INTRAVENOUS
  Filled 2023-12-23: qty 2

## 2023-12-23 MED ORDER — KETOROLAC TROMETHAMINE 15 MG/ML IJ SOLN
15.0000 mg | Freq: Once | INTRAMUSCULAR | Status: AC
  Administered 2023-12-23: 15 mg via INTRAVENOUS
  Filled 2023-12-23: qty 1

## 2023-12-23 MED ORDER — ALUM & MAG HYDROXIDE-SIMETH 200-200-20 MG/5ML PO SUSP
30.0000 mL | Freq: Once | ORAL | Status: AC
  Administered 2023-12-23: 30 mL via ORAL
  Filled 2023-12-23: qty 30

## 2023-12-23 MED ORDER — DICYCLOMINE HCL 10 MG/5ML PO SOLN
10.0000 mg | Freq: Once | ORAL | Status: AC
  Administered 2023-12-23: 10 mg via ORAL
  Filled 2023-12-23: qty 5

## 2023-12-23 MED ORDER — IOHEXOL 300 MG/ML  SOLN
100.0000 mL | Freq: Once | INTRAMUSCULAR | Status: AC | PRN
  Administered 2023-12-24: 100 mL via INTRAVENOUS

## 2023-12-23 NOTE — ED Triage Notes (Signed)
 Pt reports abdominal pain, emesis, and chest pain after vomiting. Pt denies fevers.

## 2023-12-23 NOTE — ED Provider Notes (Signed)
   Accepted handoff at shift change from [facility]. Please see prior provider note for more detail.   Briefly: Patient is 33 y.o. " presents with complaints of abdominal pain, chest pain and vomiting.  No diarrhea.  No urinary or vaginal symptoms.  States she started having abdominal pain early this morning started throwing up and then her chest started hurting.  Not associated with shortness of breath.  No cardiac history or prior blood clots.  No cough or congestion."  DDX: concern for ACS, PE, aortic dissection, appendicitis, cholecystitis, gastroenteritis, hyperemesis   Plan:  - labs, imaging and EKG pending.  - Initial and repeat troponin within normal limits.  hCG negative.  Lipase within normal limits.  CBC without leukocytosis.  There is mild anemia with hemoglobin 11.9.  UA not concerning for infection.  Magnesium within normal limits.  CMP with mildly low CO2 at 21.  No anion gap.  Chest x-ray without acute cardiopulmonary disease.  CT abdomen/pelvis without acute concern.  EKG with sinus arrhythmia. - Patient provided with a GI cocktail and pain management.  Repeat examination with patient showing that her symptoms have greatly improved.  Patient now tolerating PO intake.  Patient ready go home.  Patient believes that her symptoms may be due to a combination of bad food mix with alcohol from last night. I will send patient home with Zofran  and work note. - Patient afebrile with stable vitals.  Provided with return precautions.  Discharged in good condition.    [place], [place] 12/24/23 0321    Mordecai Applebaum, MD 12/26/23 [phone]

## 2023-12-23 NOTE — ED Provider Notes (Signed)
 [place] EMERGENCY DEPARTMENT AT [facility] Provider Note   CSN: [id number] Arrival date & time: 12/23/23  1820     History  Chief Complaint  Patient presents with   Abdominal Pain    Kelli Parks is a 33 y.o. female has history of GERD, gastritis presents with complaints of abdominal pain, chest pain and vomiting.  No diarrhea.  No urinary or vaginal symptoms.  States she started having abdominal pain early this morning started throwing up and then her chest started hurting.  Not associated with shortness of breath.  No cardiac history or prior blood clots.  No cough or congestion.   Abdominal Pain     Past Medical History:  Diagnosis Date   Allergy    Gastritis    HSV-2 (herpes simplex virus 2) infection    Past Surgical History:  Procedure Laterality Date   NO PAST SURGERIES       Home Medications Prior to Admission medications   Medication Sig Start Date End Date Taking? Authorizing Provider  naproxen  (NAPROSYN ) 500 MG tablet Take 1 tablet (500 mg total) by mouth 2 (two) times daily. 08/29/23   Piontek, [place] D[place], MD  QUEtiapine  (SEROQUEL ) 25 MG tablet Take 1-2 tablets (25-50 mg total) by mouth 2 (two) times daily. 08/04/23 09/03/23  Arturo Late, MD      Allergies    Patient has no known allergies.    Review of Systems   Review of Systems  Gastrointestinal:  Positive for abdominal pain.    Physical Exam Updated Vital Signs BP (!) 140/56   Pulse (!) 53   Temp 98.5 F (36.9 C)   Resp 18   LMP 12/22/2023   SpO2 100%  Physical Exam Vitals and nursing note reviewed.  Constitutional:      General: She is not in acute distress.    Appearance: She is well-developed.  HENT:     Head: Normocephalic and atraumatic.  Eyes:     Conjunctiva/sclera: Conjunctivae normal.  Cardiovascular:     Rate and Rhythm: Normal rate and regular rhythm.     Heart sounds: No murmur heard. Pulmonary:     Effort: Pulmonary effort is normal. No respiratory  distress.     Breath sounds: Normal breath sounds.  Abdominal:     Palpations: Abdomen is soft.     Tenderness: There is abdominal tenderness.     Comments: Generalized abdominal tenderness without rebound or guarding  Musculoskeletal:        General: No swelling.     Cervical back: Neck supple.  Skin:    General: Skin is warm and dry.     Capillary Refill: Capillary refill takes less than 2 seconds.  Neurological:     Mental Status: She is alert.  Psychiatric:        Mood and Affect: Mood normal.     ED Results / Procedures / Treatments   Labs (all labs ordered are listed, but only abnormal results are displayed) Labs Reviewed  COMPREHENSIVE METABOLIC PANEL WITH GFR - Abnormal; Notable for the following components:      Result Value   CO2 21 (*)    Glucose, Bld 114 (*)    All other components within normal limits  CBC - Abnormal; Notable for the following components:   Hemoglobin 11.9 (*)    All other components within normal limits  LIPASE, BLOOD  HCG, SERUM, QUALITATIVE  URINALYSIS, ROUTINE W REFLEX MICROSCOPIC  TROPONIN I (HIGH SENSITIVITY)  EKG None  Radiology DG Chest 2 View Result Date: 12/23/2023 CLINICAL DATA:  Chest and abdominal pain EXAM: CHEST - 2 VIEW COMPARISON:  03/09/2014 FINDINGS: The heart size and mediastinal contours are within normal limits. Both lungs are clear. The visualized skeletal structures are unremarkable. IMPRESSION: No active cardiopulmonary disease. Electronically Signed   By: Violeta Grey M.D.   On: 12/23/2023 23:24    Procedures Procedures    Medications Ordered in ED Medications  ketorolac  (TORADOL ) 15 MG/ML injection 15 mg (15 mg Intravenous Given 12/23/23 2309)  ondansetron  (ZOFRAN ) injection 4 mg (4 mg Intravenous Given 12/23/23 2310)  alum & mag hydroxide-simeth (MAALOX/MYLANTA) 200-200-20 MG/5ML suspension 30 mL (30 mLs Oral Given 12/23/23 2309)  dicyclomine  (BENTYL ) 10 MG/5ML solution 10 mg (10 mg Oral Given 12/23/23 2310)     ED Course/ Medical Decision Making/ A&P                                 Medical Decision Making Amount and/or Complexity of Data Reviewed Labs: ordered.   This patient presents to the ED with chief complaint(s) of abdominal pain, chest pain, vomiting.  The complaint involves an extensive differential diagnosis and also carries with it a high risk of complications and morbidity.   Pertinent past medical history as listed in HPI  The differential diagnosis includes  ACS, PE, aortic dissection, appendicitis, cholecystitis, gastroenteritis, hyperemesis Additional history obtained: Records reviewed Care Everywhere/External Records  Assessment and management:   Hemodynamically stable, afebrile nontoxic-appearing patient presenting with complaints of abdominal pain, vomiting and chest pain that started earlier today.  No cough or congestion to suggest URI.  Chest pain is not exertional.  She has no cardiac history.  She is PERC negative.  Considered dissection, however she has symmetric pulses, no neurodeficits and is normotensive.  She does admit to cannabis use but does not appear to have a formal diagnosis of cannabis hyperemesis in the past.  On exam she has diffuse abdominal tenderness without rebound or guarding.  No peritoneal signs.  Her lab work overall is without any significant abnormality.  Given diffuse tenderness, will obtain CT abdomen pelvis will obtain basic labs, EKG and chest x-ray.  Independent ECG interpretation:  pending  Independent labs interpretation:  The following labs were independently interpreted:  CMP and CBC without significant abnormality, lipase within normal limits, hCG negative  Independent visualization and interpretation of imaging: I independently visualized the following imaging with scope of interpretation limited to determining acute life threatening conditions related to emergency care:  CT abdomen pelvis pending CXR no active cardiopulmonary  disease  Consultations obtained:   None at this time  Disposition:   Signout given to [facility], PA-C.  Please see her note for the remainder of the visit.  Disposition pending workup.  Social Determinants of Health:   none  This note was dictated with voice recognition software.  Despite best efforts at proofreading, errors may have occurred which can change the documentation meaning.          Final Clinical Impression(s) / ED Diagnoses Final diagnoses:  Generalized abdominal pain  Atypical chest pain  Nausea and vomiting, unspecified vomiting type    Rx / DC Orders ED Discharge Orders     None         Felicie Horning, PA-C 12/23/23 2336    Mordecai Applebaum, MD 12/26/23 [phone]

## 2023-12-23 NOTE — ED Provider Notes (Signed)
 ED ECG REPORT   Date: 12/23/2023  Rate: 64  Rhythm: normal sinus rhythm and sinus arrhythmia  QRS Axis: normal  Intervals: QT prolonged  ST/T Wave abnormalities: normal  Conduction Disutrbances:none  Narrative Interpretation: Sinus arrhythmia, borderline prolonged QT interval.  When compared with ECG of 03/09/2014, no significant changes are seen.  Old EKG Reviewed: unchanged  I have personally reviewed the EKG tracing and agree with the computerized printout as noted.    Alissa April, MD 12/24/23 2246

## 2023-12-24 LAB — URINALYSIS, ROUTINE W REFLEX MICROSCOPIC
Bilirubin Urine: NEGATIVE
Glucose, UA: NEGATIVE mg/dL
Hgb urine dipstick: NEGATIVE
Ketones, ur: 80 mg/dL — AB
Leukocytes,Ua: NEGATIVE
Nitrite: NEGATIVE
Protein, ur: NEGATIVE mg/dL
Specific Gravity, Urine: 1.032 — ABNORMAL HIGH (ref 1.005–1.030)
pH: 7 (ref 5.0–8.0)

## 2023-12-24 LAB — TROPONIN I (HIGH SENSITIVITY)
Troponin I (High Sensitivity): 2 ng/L (ref <18)
Troponin I (High Sensitivity): 2 ng/L (ref <18)

## 2023-12-24 LAB — MAGNESIUM: Magnesium: 1.7 mg/dL (ref 1.7–2.4)

## 2023-12-24 MED ORDER — ONDANSETRON 4 MG PO TBDP: 4.0000 mg | ORAL_TABLET | Freq: Three times a day (TID) | ORAL | 0 refills | Status: AC | PRN

## 2023-12-24 MED ORDER — ONDANSETRON HCL 4 MG/2ML IJ SOLN: 4.0000 mg | Freq: Once | INTRAMUSCULAR | Status: DC

## 2023-12-24 NOTE — Discharge Instructions (Addendum)
I am glad you are feeling better. Please follow up with your primary care provider. Seek emergency care if experiencing any new or worsening symptoms.

## 2023-12-24 NOTE — ED Notes (Signed)
 Able to swallow and keep the fluid down with out any discomfort.

## 2023-12-29 NOTE — ED Notes (Signed)
 12/29/23 1125 opened chart at pts request to clarify work note. Copy left at [facility] window for pick up.

## 2024-01-27 ENCOUNTER — Encounter: Payer: Self-pay | Admitting: *Deleted

## 2024-03-01 ENCOUNTER — Emergency Department (HOSPITAL_COMMUNITY)

## 2024-03-01 ENCOUNTER — Encounter (HOSPITAL_COMMUNITY): Payer: Self-pay

## 2024-03-01 ENCOUNTER — Emergency Department (HOSPITAL_COMMUNITY)
Admission: EM | Admit: 2024-03-01 | Discharge: 2024-03-01 | Disposition: A | Discharge status: Home / Self Care | Attending: Emergency Medicine | Admitting: Emergency Medicine

## 2024-03-01 ENCOUNTER — Other Ambulatory Visit: Payer: Self-pay

## 2024-03-01 DIAGNOSIS — D259 Leiomyoma of uterus, unspecified: Secondary | ICD-10-CM | POA: Insufficient documentation

## 2024-03-01 DIAGNOSIS — D72829 Elevated white blood cell count, unspecified: Secondary | ICD-10-CM | POA: Diagnosis not present

## 2024-03-01 DIAGNOSIS — R109 Unspecified abdominal pain: Secondary | ICD-10-CM

## 2024-03-01 DIAGNOSIS — R1030 Lower abdominal pain, unspecified: Secondary | ICD-10-CM | POA: Diagnosis present

## 2024-03-01 DIAGNOSIS — R112 Nausea with vomiting, unspecified: Secondary | ICD-10-CM

## 2024-03-01 LAB — COMPREHENSIVE METABOLIC PANEL WITH GFR
ALT: 19 U/L (ref 0–44)
AST: 32 U/L (ref 15–41)
Albumin: 4.2 g/dL (ref 3.5–5.0)
Alkaline Phosphatase: 48 U/L (ref 38–126)
Anion gap: 16 — ABNORMAL HIGH (ref 5–15)
BUN: 9 mg/dL (ref 6–20)
CO2: 20 mmol/L — ABNORMAL LOW (ref 22–32)
Calcium: 9.3 mg/dL (ref 8.9–10.3)
Chloride: 100 mmol/L (ref 98–111)
Creatinine, Ser: 0.89 mg/dL (ref 0.44–1.00)
GFR, Estimated: >60 mL/min (ref >60)
Glucose, Bld: 120 mg/dL — ABNORMAL HIGH (ref 70–99)
Potassium: 3.4 mmol/L — ABNORMAL LOW (ref 3.5–5.1)
Sodium: 136 mmol/L (ref 135–145)
Total Bilirubin: 0.6 mg/dL (ref 0.0–1.2)
Total Protein: 7.6 g/dL (ref 6.5–8.1)

## 2024-03-01 LAB — D-DIMER, QUANTITATIVE: D-Dimer, Quant: <0.27 ug{FEU}/mL (ref 0.00–0.50)

## 2024-03-01 LAB — CBC
HCT: 39.5 % (ref 36.0–46.0)
Hemoglobin: 12.7 g/dL (ref 12.0–15.0)
MCH: 28.3 pg (ref 26.0–34.0)
MCHC: 32.2 g/dL (ref 30.0–36.0)
MCV: 88.2 fL (ref 80.0–100.0)
Platelets: 366 K/uL (ref 150–400)
RBC: 4.48 MIL/uL (ref 3.87–5.11)
RDW: 14.3 % (ref 11.5–15.5)
WBC: 11.3 K/uL — ABNORMAL HIGH (ref 4.0–10.5)
nRBC: 0 % (ref 0.0–0.2)

## 2024-03-01 LAB — TROPONIN I (HIGH SENSITIVITY)
Troponin I (High Sensitivity): <2 ng/L (ref <18)
Troponin I (High Sensitivity): 3 ng/L (ref <18)

## 2024-03-01 LAB — LIPASE, BLOOD: Lipase: 23 U/L (ref 11–51)

## 2024-03-01 LAB — HCG, SERUM, QUALITATIVE: Preg, Serum: NEGATIVE

## 2024-03-01 MED ORDER — OXYCODONE-ACETAMINOPHEN 5-325 MG PO TABS
1.0000 | ORAL_TABLET | Freq: Once | ORAL | Status: AC
  Administered 2024-03-01: 1 via ORAL
  Filled 2024-03-01: qty 1

## 2024-03-01 MED ORDER — MORPHINE SULFATE (PF) 4 MG/ML IV SOLN
4.0000 mg | Freq: Once | INTRAVENOUS | Status: AC
  Administered 2024-03-01: 4 mg via INTRAVENOUS
  Filled 2024-03-01: qty 1

## 2024-03-01 MED ORDER — ONDANSETRON 4 MG PO TBDP
4.0000 mg | ORAL_TABLET | Freq: Once | ORAL | Status: AC | PRN
  Administered 2024-03-01: 4 mg via ORAL
  Filled 2024-03-01: qty 1

## 2024-03-01 MED ORDER — IOHEXOL 350 MG/ML SOLN
75.0000 mL | Freq: Once | INTRAVENOUS | Status: AC | PRN
  Administered 2024-03-01: 75 mL via INTRAVENOUS

## 2024-03-01 NOTE — ED Provider Notes (Signed)
 Supervised resident visit.  Patient here with abdominal pain chest discomfort.  Unremarkable vitals.  No fever.  EKG shows sinus bradycardia.  No ischemic changes.  Differential includes gas/constipation/less likely bowel obstruction ACS PE.  She had blood work done including troponin D-dimer CT scan of the abdomen and pelvis chest x-ray that were all unremarkable.  No significant leukocytosis anemia electrolyte or kidney injury.  CT scan showed a uterine fibroid but otherwise no acute findings on CT scan and chest x-ray.  Suspect pain could be GI related.  Will have her follow-up with primary care and OB/GYN.  Discharge.  This chart was dictated using voice recognition software.  Despite best efforts to proofread,  errors can occur which can change the documentation meaning.    Kelli Cornet, DO 03/01/24 2006

## 2024-03-01 NOTE — ED Provider Triage Note (Cosign Needed Addendum)
 Emergency Medicine Provider Triage Evaluation Note  Kelli Parks , a 33 y.o. female  was evaluated in triage.  Pt complains of abdominal pain, nausea, vomiting, chest pain, shortness of breath.  Symptoms have been ongoing since this morning, patient did drink alcohol over the weekend but none since, is unable to tell me how much alcohol she had to drink, denies substance use.  Describes multiple episodes of vomiting today but denies fever, diarrhea.  She believes that her chest pain/shortness of breath is secondary to recurrent episodes of vomiting.  Review of Systems  Positive: As above Negative: As above  Physical Exam  BP (!) 140/77 (BP Location: Right Arm)   Pulse (!) 48   Temp (!) 97.5 F (36.4 C)   Resp (!) 24   Ht 5' 3 (1.6 m)   Wt 77.1 kg   LMP 02/29/2024 (Exact Date)   SpO2 100%   BMI 30.11 kg/m  Gen:   Awake, no distress   Resp:  Normal effort  MSK:   Moves extremities without difficulty  Other:  Patient defers abdominal exam stating it will hurt too much if you touch it  Medical Decision Making  Medically screening exam initiated at 3:49 PM.  Appropriate orders placed.  Kelli Parks was informed that the remainder of the evaluation will be completed by another provider, this initial triage assessment does not replace that evaluation, and the importance of remaining in the ED until their evaluation is complete.  Initially requests pain medication but denies nausea medication despite reports of continued nausea, after further discussion she did agree to take Zofran .  6PM: Patient was brought back to triage for her second troponin, complaining of worsening chest/abdominal pain, did have Percocet earlier but threw this up, given patient does complain of worsening chest pain I will add on a D-dimer and order morphine  for pain control given that she now has an IV.       Kelli Parks, [place] 03/01/24 [phone]

## 2024-03-01 NOTE — ED Triage Notes (Signed)
 POV complaints of chest pain, abdominal pain, and back pain x4 hours, has NVD, pain started after she went to bathroom, ETOH over the weekend, endorses drug use this AM, has had an episode like this previously. No meds taken currently for relief.

## 2024-03-01 NOTE — Discharge Instructions (Addendum)
 You were seen today for abdominal pain and vomiting. While you were here we monitored your vitals, performed a physical exam, and labs/imaging scans. These were all reassuring and there is no indication for any further testing or intervention in the emergency department at this time.   Things to do:  - Follow up with your primary care provider within the next 1-2 weeks as needed - Schedule an appointment with OB/GYN clinic for further follow-up on your uterine fibroids/pelvic pain symptoms - Take Zofran  (already have at home) every 8 hours as needed for nausea/vomiting  Return to the emergency department if you have any new or worsening symptoms, or if you have any other serious medical concerns.

## 2024-03-01 NOTE — ED Provider Notes (Signed)
 [place] EMERGENCY DEPARTMENT AT [facility] Provider Note   CSN: [id number] Arrival date & time: 03/01/24  1439   Patient presents with: Chest Pain and Abdominal Pain   Kelli Parks is a 33 y.o. female with past medical history of abnormal uterine bleeding and GERD who presents for evaluation of lower abdominal pain and vomiting that began this morning.  Patient states that she was drinking yesterday, unclear how much she drank, and states that when she woke up this morning she had a normal bowel movement and then later in the morning began feeling lower abdominal cramping that did not resolve over several hours.  Denies any frank nausea but has experienced several episodes of emesis that she attributes to trying to alleviate the abdominal pain.  Had a similar episode back in May of the same symptoms.  Reported chest pain that she attributes to her esophagus that began after multiple episodes of vomiting, states that it burned in her chest.  Denies radiation of the chest discomfort.  Denies any associated shortness of breath aside from immediately after vomiting.  Denies any recent hematemesis or hematochezia.  Denies any increased urinary urgency/frequency or dysuria.  No recent fever/chills or URI symptoms.  States that she last smoked marijuana earlier this morning, denies prior episodes of abdominal pain/vomiting associated with THC use.  States that she feels the abdominal pain in her lower pelvis almost near her vagina but on the inside.  Denies any abnormal vaginal bleeding or discharge recently.  Does not currently follow with a OB/GYN.   Prior to Admission medications   Medication Sig Start Date End Date Taking? Authorizing Provider  naproxen  (NAPROSYN ) 500 MG tablet Take 1 tablet (500 mg total) by mouth 2 (two) times daily. 08/29/23   Piontek, Rocky, MD  ondansetron  (ZOFRAN -ODT) 4 MG disintegrating tablet Take 1 tablet (4 mg total) by mouth every 8 (eight) hours as needed  for nausea. 12/24/23   Hoy Nidia FALCON, PA-C  QUEtiapine  (SEROQUEL ) 25 MG tablet Take 1-2 tablets (25-50 mg total) by mouth 2 (two) times daily. 08/04/23 09/03/23  Curry Leni DASEN, MD    Allergies: Patient has no known allergies.     Updated Vital Signs BP (!) 140/77 (BP Location: Right Arm)   Pulse (!) 48   Temp (!) 97.5 F (36.4 C)   Resp (!) 24   Ht 5' 3 (1.6 m)   Wt 77.1 kg   LMP 02/29/2024 (Exact Date)   SpO2 100%   BMI 30.11 kg/m   Physical Exam Vitals reviewed.  Constitutional:      General: She is not in acute distress.    Appearance: She is not toxic-appearing or diaphoretic.  HENT:     Head: Normocephalic and atraumatic.     Nose: Nose normal. No rhinorrhea.     Mouth/Throat:     Mouth: Mucous membranes are moist.     Pharynx: Oropharynx is clear. No oropharyngeal exudate.  Eyes:     Extraocular Movements: Extraocular movements intact.     Conjunctiva/sclera: Conjunctivae normal.     Pupils: Pupils are equal, round, and reactive to light.  Cardiovascular:     Rate and Rhythm: Regular rhythm. Bradycardia present.     Pulses: Normal pulses.     Heart sounds: No murmur heard.    No gallop.  Pulmonary:     Effort: Pulmonary effort is normal. No respiratory distress.     Breath sounds: Normal breath sounds.  Abdominal:  General: Abdomen is flat.     Palpations: Abdomen is soft.     Tenderness: There is no abdominal tenderness. There is no right CVA tenderness, left CVA tenderness, guarding or rebound. Negative signs include Murphy's sign and McBurney's sign.  Musculoskeletal:        General: No deformity. Normal range of motion.     Cervical back: Normal range of motion and neck supple. No rigidity.     Right lower leg: No edema.     Left lower leg: No edema.  Skin:    General: Skin is warm and dry.     Capillary Refill: Capillary refill takes less than 2 seconds.     Coloration: Skin is not jaundiced or pale.  Neurological:     General: No focal  deficit present.     Mental Status: She is alert and oriented to person, place, and time. Mental status is at baseline.     (all labs ordered are listed, but only abnormal results are displayed) Labs Reviewed  COMPREHENSIVE METABOLIC PANEL WITH GFR - Abnormal; Notable for the following components:      Result Value   Potassium 3.4 (*)    CO2 20 (*)    Glucose, Bld 120 (*)    Anion gap 16 (*)    All other components within normal limits  CBC - Abnormal; Notable for the following components:   WBC 11.3 (*)    All other components within normal limits  LIPASE, BLOOD  HCG, SERUM, QUALITATIVE  D-DIMER, QUANTITATIVE  URINALYSIS, ROUTINE W REFLEX MICROSCOPIC  TROPONIN I (HIGH SENSITIVITY)  TROPONIN I (HIGH SENSITIVITY)    EKG: None  Radiology: CT ABDOMEN PELVIS W CONTRAST Result Date: 03/01/2024 CLINICAL DATA:  Abdominal pain, nausea, vomiting. EXAM: CT ABDOMEN AND PELVIS WITH CONTRAST TECHNIQUE: Multidetector CT imaging of the abdomen and pelvis was performed using the standard protocol following bolus administration of intravenous contrast. RADIATION DOSE REDUCTION: This exam was performed according to the departmental dose-optimization program which includes automated exposure control, adjustment of the mA and/or kV according to patient size and/or use of iterative reconstruction technique. CONTRAST:  75mL OMNIPAQUE  IOHEXOL  350 MG/ML SOLN COMPARISON:  Dec 24, 2023. FINDINGS: Lower chest: No acute abnormality. Hepatobiliary: No focal liver abnormality is seen. No gallstones, gallbladder wall thickening, or biliary dilatation. Pancreas: Unremarkable. No pancreatic ductal dilatation or surrounding inflammatory changes. Spleen: Normal in size without focal abnormality. Adrenals/Urinary Tract: Adrenal glands are unremarkable. Kidneys are normal, without renal calculi, focal lesion, or hydronephrosis. Bladder is unremarkable. Stomach/Bowel: Stomach is within normal limits. Appendix appears normal.  No evidence of bowel wall thickening, distention, or inflammatory changes. Vascular/Lymphatic: No significant vascular findings are present. No enlarged abdominal or pelvic lymph nodes. Reproductive: 3 cm uterine fibroid is noted. No adnexal abnormality. Other: No ascites or hernia is noted. Musculoskeletal: No acute or significant osseous findings. IMPRESSION: 3 cm uterine fibroid. No other abnormality seen in the abdomen or pelvis. Electronically Signed   By: Lynwood Landy Raddle M.D.   On: 03/01/2024 17:53   DG Chest 1 View Result Date: 03/01/2024 CLINICAL DATA:  Chest pain. EXAM: CHEST  1 VIEW COMPARISON:  Dec 23, 2023 FINDINGS: The heart size and mediastinal contours are within normal limits. Both lungs are clear. The visualized skeletal structures are unremarkable. IMPRESSION: No active disease. Electronically Signed   By: Lynwood Landy Raddle M.D.   On: 03/01/2024 16:27     Medications Ordered in the ED  ondansetron  (ZOFRAN -ODT) disintegrating tablet 4 mg (  4 mg Oral Given 03/01/24 1551)  oxyCODONE -acetaminophen  (PERCOCET/ROXICET) 5-325 MG per tablet 1 tablet (1 tablet Oral Given 03/01/24 1551)  iohexol  (OMNIPAQUE ) 350 MG/ML injection 75 mL (75 mLs Intravenous Contrast Given 03/01/24 1741)  morphine  (PF) 4 MG/ML injection 4 mg (4 mg Intravenous Given 03/01/24 1828)    Clinical Course as of 03/01/24 2012  Mon Mar 01, 2024  1939 CT ABDOMEN PELVIS W CONTRAST 3 cm uterine fibroid. No other abnormality seen in the abdomen or pelvis.   [AD]  1939 DG Chest 1 View No active disease. [AD]  1953 Lipase: 23 [AD]  1953 Trops both <4 [AD]    Clinical Course User Index [AD] Raoul Rake, MD    Medical Decision Making Amount and/or Complexity of Data Reviewed Labs: ordered. Decision-making details documented in ED Course. Radiology:  Decision-making details documented in ED Course.  Risk Prescription drug management.   ***  {Document critical care time when appropriate  Document review of labs  and clinical decision tools ie [id number], etc  Document your independent review of radiology images and any outside records  Document your discussion with family members, caretakers and with consultants  Document social determinants of health affecting pt's care  Document your decision making why or why not admission, treatments were needed:[id number]:::1}   Final diagnoses:  Nausea and vomiting, unspecified vomiting type  Abdominal pain, unspecified abdominal location  Uterine leiomyoma, unspecified location    ED Discharge Orders     None

## 2024-03-05 ENCOUNTER — Ambulatory Visit: Admitting: Student

## 2024-03-08 ENCOUNTER — Ambulatory Visit (INDEPENDENT_AMBULATORY_CARE_PROVIDER_SITE_OTHER): Admitting: Student

## 2024-03-08 VITALS — BP 138/80 | HR 75 | Ht 63.0 in | Wt 185.6 lb

## 2024-03-08 DIAGNOSIS — R109 Unspecified abdominal pain: Secondary | ICD-10-CM | POA: Insufficient documentation

## 2024-03-08 DIAGNOSIS — R1030 Lower abdominal pain, unspecified: Secondary | ICD-10-CM | POA: Diagnosis not present

## 2024-03-08 NOTE — Patient Instructions (Addendum)
 It was great to see you! Thank you for allowing me to participate in your care!  I recommend that you always bring your medications to each appointment as this makes it easy to ensure we are on the correct medications and helps us  not miss when refills are needed.  Our plans for today:  - Abdominal Pain  I am not sure where your abdominal pain is coming from, but it doesn't not sound like a urinary tract infection, or GI illness. I recommend you follow up with gynecology. It may be related to your fibroid.   Follow up with Gynecology   Pain Management  Tylenol  650 mg every 8 hours as needed (No more than 3,000 mg in 24 hours)  Ibuprofen 400 mg eery 8 hours as needed (No more than 2,400 mg in 24 hours) *stagger these by 4 hours, if needed, so you are taking medicine every 4 hours.     Seek Emergency Medical care if   You develop belly pain and fevers   If pain poorly controlled   If you worsening abdominal tenderness  Take care and seek immediate care sooner if you develop any concerns.   Dr. Penne Rhein, MD [facility] Medicine

## 2024-03-08 NOTE — Progress Notes (Cosign Needed)
 SUBJECTIVE:   CHIEF COMPLAINT / HPI:   ED F/u -7/15 for pelvic/abdominal cramping > Neg CT Abd/pelv & CXR, Neg trop/D-Dimer/lipase > morphine /zophran -12/23/23 UC for pelvic/abdominal cramping > Neg CT Abd/Pelv and CXR > GI cocktail  Today:Not having pain currently  In past, she experiences severe abdominal pain located in the lower abdomen near the pelvis, which has led to two emergency room visits in the past three months. The pain is not currently present but was particularly severe in May and recurred recently. A CT scan was performed during the recent ER visit. The pain worsens after bowel movements and is associated with nausea, though she did not vomit significantly. She was treated with morphine  and Zofran  in the ER, but nausea subsided without the need for Zofran  at home.  She has a history of GERD diagnosed approximately six years ago, associated with increased gas and discomfort. She does not take regular medication for GERD but manages it through diet. Occasional esophageal pain is reported, but none since the recent ER visit.  She has a history of urinary tract infections following a change in sexual partners but currently has no urinary symptoms such as pain, frequency, or difficulty urinating. Past urinary discomfort was often relieved by bowel movements or passing gas. She also reports a history of feeling pressure to urinate during intercourse, but this has not been a recent issue.  Her menstrual cycle is regular, though she experiences heavy bleeding at the start. The abdominal pain seems to occur after her menstrual period ends, with the last episode coinciding with the end of her cycle. The pain is described as cramping, affecting her lower abdomen and back.   PERTINENT  PMH / PSH:   OBJECTIVE:  BP 138/80   Pulse 75   Ht 5' 3 (1.6 m)   Wt 185 lb 9.6 oz (84.2 kg)   LMP 02/29/2024 (Exact Date)   SpO2 99%   BMI 32.88 kg/m  Physical Exam Constitutional:      General:  She is not in acute distress.    Appearance: Normal appearance. She is not ill-appearing.  Abdominal:     General: There is no distension.     Palpations: Abdomen is soft. There is no mass.     Tenderness: There is no abdominal tenderness. There is no right CVA tenderness, left CVA tenderness, guarding or rebound.     Hernia: No hernia is present.  Neurological:     Mental Status: She is alert.  Psychiatric:        Mood and Affect: Mood normal.        Behavior: Behavior normal.      ASSESSMENT/PLAN:   Assessment & Plan Lower abdominal pain Patient comes in with parent for concern about recurrent abdominal pain it has been an issue on 2 occasions, required her to go to the emergency department.  Not currently having any symptoms.Patient notes that abdominal pain is located lower in her abdomen/low back, related to the end of her cycle.  Patient reports that the end of her cycle she ends up having abdominal cramping/pelvic pain that required her to go to the ED.  Patient denies any triggers of episodes by food, fevers, intercourse, or urination.  Patient denies any systemic symptoms, and presently has no abdominal pain.  Unlikely to be urinary source, given recent ED visit negative for UTI, and no urinary symptoms.  Unlikely to be GI illness, as patient has no systemic symptoms, and in normal bathroom habits.  Possibly  consider endometriosis, but less likely given timing of pain and cycle.  Patient has follow-up with gynecology; will recommend she follow-up with gynecology.  Patient reports Tylenol  does help, will recommend continuing Tylenol /ibuprofen as needed. - Follow-up gynecology - Tylenol   650 mg/ibuprofen 400 mg prn q8h  No follow-ups on file. Penne Rhein, MD 03/08/2024, 9:14 AM PGY-3, [facility]

## 2024-03-08 NOTE — Assessment & Plan Note (Addendum)
 Patient comes in with parent for concern about recurrent abdominal pain it has been an issue on 2 occasions, required her to go to the emergency department.  Not currently having any symptoms.Patient notes that abdominal pain is located lower in her abdomen/low back, related to the end of her cycle.  Patient reports that the end of her cycle she ends up having abdominal cramping/pelvic pain that required her to go to the ED.  Patient denies any triggers of episodes by food, fevers, intercourse, or urination.  Patient denies any systemic symptoms, and presently has no abdominal pain.  Unlikely to be urinary source, given recent ED visit negative for UTI, and no urinary symptoms.  Unlikely to be GI illness, as patient has no systemic symptoms, and in normal bathroom habits.  Possibly consider endometriosis, but less likely given timing of pain and cycle.  Patient has follow-up with gynecology; will recommend she follow-up with gynecology.  Patient reports Tylenol  does help, will recommend continuing Tylenol /ibuprofen as needed. - Follow-up gynecology - Tylenol   650 mg/ibuprofen 400 mg prn q8h

## 2024-05-10 ENCOUNTER — Encounter (HOSPITAL_COMMUNITY): Payer: Self-pay | Admitting: Psychiatry

## 2024-05-10 ENCOUNTER — Telehealth (HOSPITAL_BASED_OUTPATIENT_CLINIC_OR_DEPARTMENT_OTHER): Admitting: Psychiatry

## 2024-05-10 VITALS — Wt 185.0 lb

## 2024-05-10 DIAGNOSIS — F1920 Other psychoactive substance dependence, uncomplicated: Secondary | ICD-10-CM

## 2024-05-10 DIAGNOSIS — F319 Bipolar disorder, unspecified: Secondary | ICD-10-CM

## 2024-05-10 DIAGNOSIS — F1090 Alcohol use, unspecified, uncomplicated: Secondary | ICD-10-CM | POA: Diagnosis not present

## 2024-05-10 MED ORDER — LAMOTRIGINE 25 MG PO TABS: ORAL_TABLET | ORAL | 1 refills | Status: DC

## 2024-05-10 NOTE — Progress Notes (Signed)
 [facility] MD Virtual Progress Note   Patient Location: Home Provider Location: Home Office  I connect with patient by video and verified that I am speaking with correct person by using two identifiers. I discussed the limitations of evaluation and management by telemedicine and the availability of in person appointments. I also discussed with the patient that there may be a patient responsible charge related to this service. The patient expressed understanding and agreed to proceed.  Kelli Parks [id number] 33 y.o.  05/10/2024 1:24 PM  History of Present Illness:  Patient is evaluated by video session.  She was last seen in December 2024.  She admitted not taking the medication as prescribed.  She was taking on and off until 2 months ago she completely out and started to have increased irritability, anxiety, anger.  Last week she has an episode where she described having issues with a coworker.  She felt very paranoid because she believed the coworker was looking at her and she was not comfortable.  She admitted a lot of emotional feeling.  She reported crying, irritability and not sleeping well.  She started relationship which is 58-month-old.  She admitted some pressure from the partner to get better but she also like herself to get better.  She has cut down her cannabis use and drinking.  Her last cannabis was this weekend.  She does drink wine on and off but overall amount has cut down.  We have recommended CDIOP but patient felt that she can stop the drugs and alcohol on her own.  She admitted weight gain which she believed because of stress.  She works for [employer] and her job is very challenging and stressful.  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  She reported impulsivity and anger.  She had a good support from her mother, aunt and her father.  Her parents are separated.  She denies any active or passive suicidal thoughts or  homicidal thoughts.  She also reported Seroquel  causes excessive sedation and sleep and even though she was prescribed 1 to 2 tablets she never took 2 tablet because 1 was enough to cause sedation.  She is open to try a different medication to help her mood and irritability.  Patient recently was seen in the emergency room this July for chest pain.  Her enzymes were normal.  Her potassium was low, glucose 120 and her BUN/creatinine were normal.  Past Psychiatric History: History of drinking and using cocaine, cannabis use.  History of DUI in 2023.  History of mood swing, anger, highs and lows, road rage, excessive buying and making impulsive decisions.  No history of suicidal attempt, inpatient treatment.  PCP prescribed Abilify  but never picked up from the pharmacy.    Past Medical History:  Diagnosis Date   Allergy    Gastritis    HSV-2 (herpes simplex virus 2) infection     Outpatient Encounter Medications as of 05/10/2024  Medication Sig   naproxen  (NAPROSYN ) 500 MG tablet Take 1 tablet (500 mg total) by mouth 2 (two) times daily.   ondansetron  (ZOFRAN -ODT) 4 MG disintegrating tablet Take 1 tablet (4 mg total) by mouth every 8 (eight) hours as needed for nausea.   QUEtiapine  (SEROQUEL ) 25 MG tablet Take 1-2 tablets (25-50 mg total) by mouth 2 (two) times daily.   No facility-administered encounter medications on file as of 05/10/2024.    Recent Results (from the past 2160 hours)  Lipase, blood     Status: None   Collection Time: 03/01/24  3:37 PM  Result Value Ref Range   Lipase 23 11 - 51 U/L    Comment: Performed at [facility], [address], [place], [place] [address]  Comprehensive metabolic panel     Status: Abnormal   Collection Time: 03/01/24  3:37 PM  Result Value Ref Range   Sodium 136 135 - 145 mmol/L   Potassium 3.4 (L) 3.5 - 5.1 mmol/L   Chloride 100 98 - 111 mmol/L   CO2 20 (L) 22 - 32 mmol/L   Glucose, Bld 120 (H) 70 - 99 mg/dL    Comment: Glucose  reference range applies only to samples taken after fasting for at least 8 hours.   BUN 9 6 - 20 mg/dL   Creatinine, Ser 9.10 0.44 - 1.00 mg/dL   Calcium 9.3 8.9 - 89.6 mg/dL   Total Protein 7.6 6.5 - 8.1 g/dL   Albumin 4.2 3.5 - 5.0 g/dL   AST 32 15 - 41 U/L   ALT 19 0 - 44 U/L   Alkaline Phosphatase 48 38 - 126 U/L   Total Bilirubin 0.6 0.0 - 1.2 mg/dL   GFR, Estimated >39 >39 mL/min    Comment: (NOTE) Calculated using the CKD-EPI Creatinine Equation (2021)    Anion gap 16 (H) 5 - 15    Comment: Performed at [facility], [address], [place], [place] [address]  CBC     Status: Abnormal   Collection Time: 03/01/24  3:37 PM  Result Value Ref Range   WBC 11.3 (H) 4.0 - 10.5 K/uL   RBC 4.48 3.87 - 5.11 MIL/uL   Hemoglobin 12.7 12.0 - 15.0 g/dL   HCT 60.4 63.9 - 53.9 %   MCV 88.2 80.0 - 100.0 fL   MCH 28.3 26.0 - 34.0 pg   MCHC 32.2 30.0 - 36.0 g/dL   RDW 85.6 88.4 - 84.4 %   Platelets 366 150 - 400 K/uL   nRBC 0.0 0.0 - 0.2 %    Comment: Performed at [facility], [address], [place], [place] [address]  hCG, serum, qualitative     Status: None   Collection Time: 03/01/24  3:37 PM  Result Value Ref Range   Preg, Serum NEGATIVE NEGATIVE    Comment:        THE SENSITIVITY OF THIS METHODOLOGY IS >10 mIU/mL. Performed at [facility], [address], [place], [place] [address]   Troponin I (High Sensitivity)     Status: None   Collection Time: 03/01/24  3:48 PM  Result Value Ref Range   Troponin I (High Sensitivity) <2 <18 ng/L    Comment: (NOTE) Elevated high sensitivity troponin I (hsTnI) values and significant  changes across serial measurements may suggest ACS but many other  chronic and acute conditions are known to elevate hsTnI results.  Refer to the Links section for chest pain algorithms and additional  guidance. Performed at [facility], [address] [place], [place] [address]   Troponin I (High Sensitivity)     Status: None    Collection Time: 03/01/24  6:11 PM  Result Value Ref Range   Troponin I (High Sensitivity) 3 <18 ng/L    Comment: (NOTE) Elevated high sensitivity troponin I (hsTnI) values and significant  changes across serial measurements may suggest ACS but many other  chronic and acute conditions are known to  elevate hsTnI results.  Refer to the Links section for chest pain algorithms and additional  guidance. Performed at [facility], [address], [place], [place] [address]   D-dimer, quantitative     Status: None   Collection Time: 03/01/24  6:52 PM  Result Value Ref Range   D-Dimer, Quant <0.27 0.00 - 0.50 ug/mL-FEU    Comment: (NOTE) At the manufacturer cut-off value of 0.5 g/mL FEU, this assay has a negative predictive value of 95-100%.This assay is intended for use in conjunction with a clinical pretest probability (PTP) assessment model to exclude pulmonary embolism (PE) and deep venous thrombosis (DVT) in outpatients suspected of PE or DVT. Results should be correlated with clinical presentation. Performed at [facility], [address], [place], [place] [address]      Psychiatric Specialty Exam: Physical Exam  Review of Systems  Weight 185 lb (83.9 kg).There is no height or weight on file to calculate BMI.  General Appearance: Casual  Eye Contact:  Fair  Speech:  fast  Volume:  Increased  Mood:  Anxious, Depressed, Irritable, and emotional  Affect:  Labile  Thought Process:  Descriptions of Associations: Intact  Orientation:  Full (Time, Place, and Person)  Thought Content:  Paranoid Ideation and Rumination  Suicidal Thoughts:  No  Homicidal Thoughts:  No  Memory:  Immediate;   Fair Recent;   Fair Remote;   Fair  Judgement:  Fair  Insight:  Shallow  Psychomotor Activity:  Increased  Concentration:  Concentration: Fair and Attention Span: Fair  Recall:  [employer] of Knowledge:  Fair  Language:  Good  Akathisia:  No  Handed:  Right  AIMS (if  indicated):     Assets:  Communication Skills Desire for Improvement Housing Social Support Transportation  ADL's:  Intact  Cognition:  WNL  Sleep:  fair       03/08/2024    8:39 AM 06/30/2023   10:05 AM 01/17/2023   11:06 AM 07/03/2021    4:14 PM 06/06/2021    3:39 PM  Depression screen PHQ 2/9  Decreased Interest 2 2 2 1 1   Down, Depressed, Hopeless 2 2 3 1 1   PHQ - 2 Score 4 4 5 2 2   Altered sleeping 2 1 2 2 3   Tired, decreased energy 2 1 3 1 1   Change in appetite 0 0 2 0 0  Feeling bad or failure about yourself  0 0 1 0 1  Trouble concentrating 2 1 1 2 1   Moving slowly or fidgety/restless 3 3 2 1 2   Suicidal thoughts 0 0 0 0 0  PHQ-9 Score 13 10 16 8 10   Difficult doing work/chores Somewhat difficult Very difficult  Not difficult at all     Assessment/Plan: Bipolar I disorder (HCC) - Plan: lamoTRIgine  (LAMICTAL ) 25 MG tablet  Polysubstance dependence (HCC) - Plan: lamoTRIgine  (LAMICTAL ) 25 MG tablet  Alcohol use - Plan: lamoTRIgine  (LAMICTAL ) 25 MG tablet  Patient is 33 year old African-American employed female with history of bipolar disorder, polysubstance, alcohol use admitted not taking the medication and not keeping the appointment.  I reviewed blood work results, reviewed notes from recent emergency room visits and other collateral information.  Recent started to get worse in her symptoms.  Like to try a different medication because she feels Seroquel  is very sedating.  She has cut down her drinking and cannabis use and her plan is to completely stop.  Discussed other option and recommend to try  Lamictal  25 mg daily for 1 week and then 50 mg daily.  Emphasis given that medicine has to take every day for better efficacy.  Reminded if she has a rash then she need to stop the medication.  She is out of Seroquel  and we will not refill as she is going to try a different medication.  Encouraged to call back if she is any question, concern if she feels worsening of the  symptoms.  Follow-up in 4 to 6 weeks.   Follow Up Instructions:     I discussed the assessment and treatment plan with the patient. The patient was provided an opportunity to ask questions and all were answered. The patient agreed with the plan and demonstrated an understanding of the instructions.   The patient was advised to call back or seek an in-person evaluation if the symptoms worsen or if the condition fails to improve as anticipated.    Collaboration of Care: Other provider involved in patient's care AEB notes are available in epic to review.  Patient/Guardian was advised Release of Information must be obtained prior to any record release in order to collaborate their care with an outside provider. Patient/Guardian was advised if they have not already done so to contact the registration department to sign all necessary forms in order for us  to release information regarding their care.   Consent: Patient/Guardian gives verbal consent for treatment and assignment of benefits for services provided during this visit. Patient/Guardian expressed understanding and agreed to proceed.     Total encounter time 26 minutes which includes face-to-face time, chart reviewed, care coordination, order entry and documentation during this encounter.   Note: This document was prepared by [employer] voice dictation technology and any errors that results from this process are unintentional.    Leni ONEIDA Client, MD 05/10/2024

## 2024-05-20 ENCOUNTER — Emergency Department (HOSPITAL_BASED_OUTPATIENT_CLINIC_OR_DEPARTMENT_OTHER)
Admission: EM | Admit: 2024-05-20 | Discharge: 2024-05-21 | Disposition: A | Discharge status: Home / Self Care | Attending: Emergency Medicine | Admitting: Emergency Medicine

## 2024-05-20 ENCOUNTER — Encounter (HOSPITAL_BASED_OUTPATIENT_CLINIC_OR_DEPARTMENT_OTHER): Payer: Self-pay

## 2024-05-20 ENCOUNTER — Emergency Department (HOSPITAL_BASED_OUTPATIENT_CLINIC_OR_DEPARTMENT_OTHER)

## 2024-05-20 ENCOUNTER — Other Ambulatory Visit: Payer: Self-pay

## 2024-05-20 DIAGNOSIS — R1084 Generalized abdominal pain: Secondary | ICD-10-CM | POA: Diagnosis present

## 2024-05-20 DIAGNOSIS — R112 Nausea with vomiting, unspecified: Secondary | ICD-10-CM | POA: Diagnosis not present

## 2024-05-20 DIAGNOSIS — R109 Unspecified abdominal pain: Secondary | ICD-10-CM

## 2024-05-20 LAB — COMPREHENSIVE METABOLIC PANEL WITH GFR
ALT: 16 U/L (ref 0–44)
AST: 24 U/L (ref 15–41)
Albumin: 4.7 g/dL (ref 3.5–5.0)
Alkaline Phosphatase: 55 U/L (ref 38–126)
Anion gap: 17 — ABNORMAL HIGH (ref 5–15)
BUN: 9 mg/dL (ref 6–20)
CO2: 21 mmol/L — ABNORMAL LOW (ref 22–32)
Calcium: 9.8 mg/dL (ref 8.9–10.3)
Chloride: 98 mmol/L (ref 98–111)
Creatinine, Ser: 0.76 mg/dL (ref 0.44–1.00)
GFR, Estimated: >60 mL/min (ref >60)
Glucose, Bld: 114 mg/dL — ABNORMAL HIGH (ref 70–99)
Potassium: 3.7 mmol/L (ref 3.5–5.1)
Sodium: 136 mmol/L (ref 135–145)
Total Bilirubin: 0.5 mg/dL (ref 0.0–1.2)
Total Protein: 8.2 g/dL — ABNORMAL HIGH (ref 6.5–8.1)

## 2024-05-20 LAB — CBC
HCT: 36.7 % (ref 36.0–46.0)
Hemoglobin: 12.2 g/dL (ref 12.0–15.0)
MCH: 28.8 pg (ref 26.0–34.0)
MCHC: 33.2 g/dL (ref 30.0–36.0)
MCV: 86.6 fL (ref 80.0–100.0)
Platelets: 300 K/uL (ref 150–400)
RBC: 4.24 MIL/uL (ref 3.87–5.11)
RDW: 14.7 % (ref 11.5–15.5)
WBC: 11 K/uL — ABNORMAL HIGH (ref 4.0–10.5)
nRBC: 0 % (ref 0.0–0.2)

## 2024-05-20 LAB — LIPASE, BLOOD: Lipase: 12 U/L (ref 11–51)

## 2024-05-20 MED ORDER — SODIUM CHLORIDE 0.9 % IV BOLUS
1000.0000 mL | Freq: Once | INTRAVENOUS | Status: AC
Start: 2024-05-20 — End: 2024-05-21
  Administered 2024-05-20: 1000 mL via INTRAVENOUS

## 2024-05-20 MED ORDER — ONDANSETRON HCL 4 MG/2ML IJ SOLN
4.0000 mg | Freq: Once | INTRAMUSCULAR | Status: AC
  Administered 2024-05-20: 4 mg via INTRAVENOUS
  Filled 2024-05-20: qty 2

## 2024-05-20 MED ORDER — IOHEXOL 300 MG/ML  SOLN: 100.0000 mL | Freq: Once | INTRAMUSCULAR | Status: DC | PRN

## 2024-05-20 MED ORDER — ONDANSETRON HCL 4 MG/2ML IJ SOLN: 4.0000 mg | Freq: Once | INTRAMUSCULAR | Status: DC

## 2024-05-20 MED ORDER — MORPHINE SULFATE (PF) 4 MG/ML IV SOLN
4.0000 mg | Freq: Once | INTRAVENOUS | Status: AC
  Administered 2024-05-20: 4 mg via INTRAVENOUS
  Filled 2024-05-20: qty 1

## 2024-05-20 NOTE — ED Provider Notes (Signed)
 [place] EMERGENCY DEPARTMENT AT [facility] Provider Note   CSN: [id number] Arrival date & time: 05/20/24  2202     Patient presents with: Abdominal Pain (/)   Kelli Parks is a 33 y.o. female.  {Add pertinent medical, surgical, social history, OB history to HPI:32947} Patient is a 33 year old female with past medical history of uterine fibroids, gastritis.  Patient presenting today for evaluation of abdominal pain.  She describes generalized abdominal pain, nausea, and 1 episode of vomiting that started earlier today.  This is her third episode of this in the past several months.  Each time she has been worked up and laboratory studies and a CAT scan, however no definitive cause has been found.  No fevers or chills.  She denies any diarrhea or constipation.  No urinary complaints.       Prior to Admission medications   Medication Sig Start Date End Date Taking? Authorizing Provider  lamoTRIgine  (LAMICTAL ) 25 MG tablet Take one tab daily for one week and than two tab daily 05/10/24   Arfeen, Leni DASEN, MD  naproxen  (NAPROSYN ) 500 MG tablet Take 1 tablet (500 mg total) by mouth 2 (two) times daily. 08/29/23   Piontek, Rocky, MD  ondansetron  (ZOFRAN -ODT) 4 MG disintegrating tablet Take 1 tablet (4 mg total) by mouth every 8 (eight) hours as needed for nausea. 12/24/23   Hoy Nidia FALCON, PA-C  QUEtiapine  (SEROQUEL ) 25 MG tablet Take 1-2 tablets (25-50 mg total) by mouth 2 (two) times daily. Patient not taking: Reported on 05/10/2024 08/04/23 09/03/23  Curry Leni DASEN, MD    Allergies: Patient has no known allergies.    Review of Systems  All other systems reviewed and are negative.   Updated Vital Signs BP (!) 160/80 (BP Location: Right Arm)   Pulse (!) 56   Temp (!) 96.9 F (36.1 C)   Resp 16   Ht 5' 3 (1.6 m)   Wt 81.6 kg   LMP 05/06/2024   SpO2 100%   BMI 31.89 kg/m   Physical Exam Vitals and nursing note reviewed.  Constitutional:      General: She is  not in acute distress.    Appearance: She is well-developed. She is not diaphoretic.  HENT:     Head: Normocephalic and atraumatic.  Cardiovascular:     Rate and Rhythm: Normal rate and regular rhythm.     Heart sounds: No murmur heard.    No friction rub. No gallop.  Pulmonary:     Effort: Pulmonary effort is normal. No respiratory distress.     Breath sounds: Normal breath sounds. No wheezing.  Abdominal:     General: Bowel sounds are normal. There is no distension.     Palpations: Abdomen is soft.     Tenderness: There is generalized abdominal tenderness. There is no right CVA tenderness, left CVA tenderness, guarding or rebound.  Musculoskeletal:        General: Normal range of motion.     Cervical back: Normal range of motion and neck supple.  Skin:    General: Skin is warm and dry.  Neurological:     General: No focal deficit present.     Mental Status: She is alert and oriented to person, place, and time.     (all labs ordered are listed, but only abnormal results are displayed) Labs Reviewed  COMPREHENSIVE METABOLIC PANEL WITH GFR - Abnormal; Notable for the following components:      Result Value   CO2 21 (*)  Glucose, Bld 114 (*)    Total Protein 8.2 (*)    Anion gap 17 (*)    All other components within normal limits  CBC - Abnormal; Notable for the following components:   WBC 11.0 (*)    All other components within normal limits  LIPASE, BLOOD  URINALYSIS, ROUTINE W REFLEX MICROSCOPIC  PREGNANCY, URINE    EKG: None  Radiology: No results found.  {Document cardiac monitor, telemetry assessment procedure when appropriate:32947} Procedures   Medications Ordered in the ED  ondansetron  (ZOFRAN ) injection 4 mg (has no administration in time range)  sodium chloride  0.9 % bolus 1,000 mL (has no administration in time range)  ondansetron  (ZOFRAN ) injection 4 mg (has no administration in time range)  morphine  (PF) 4 MG/ML injection 4 mg (has no  administration in time range)      {Click here for ABCD2, HEART and other calculators REFRESH Note before signing:1}                              Medical Decision Making Amount and/or Complexity of Data Reviewed Labs: ordered. Radiology: ordered.  Risk Prescription drug management.   ***  {Document critical care time when appropriate  Document review of labs and clinical decision tools ie CHADS2VASC2, etc  Document your independent review of radiology images and any outside records  Document your discussion with family members, caretakers and with consultants  Document social determinants of health affecting pt's care  Document your decision making why or why not admission, treatments were needed:32947:::1}   Final diagnoses:  None    ED Discharge Orders     None

## 2024-05-20 NOTE — ED Triage Notes (Signed)
 PT reports having abd pain and began to vomit. Pt reports having epigastric pain after vomiting. Pt reports she has a fibroid.

## 2024-05-21 ENCOUNTER — Emergency Department (HOSPITAL_BASED_OUTPATIENT_CLINIC_OR_DEPARTMENT_OTHER)

## 2024-05-21 LAB — URINALYSIS, ROUTINE W REFLEX MICROSCOPIC
Bilirubin Urine: NEGATIVE
Glucose, UA: NEGATIVE mg/dL
Hgb urine dipstick: NEGATIVE
Ketones, ur: >80 mg/dL — AB
Leukocytes,Ua: NEGATIVE
Nitrite: NEGATIVE
Specific Gravity, Urine: 1.031 — ABNORMAL HIGH (ref 1.005–1.030)
pH: 7.5 (ref 5.0–8.0)

## 2024-05-21 LAB — PREGNANCY, URINE: Preg Test, Ur: NEGATIVE

## 2024-05-21 NOTE — Discharge Instructions (Signed)
 Return to the emergency department if you experience any new and/or concerning issues.

## 2024-06-08 ENCOUNTER — Encounter (HOSPITAL_COMMUNITY): Payer: Self-pay | Admitting: Psychiatry

## 2024-06-08 ENCOUNTER — Telehealth (HOSPITAL_BASED_OUTPATIENT_CLINIC_OR_DEPARTMENT_OTHER): Admitting: Psychiatry

## 2024-06-08 VITALS — Wt 180.0 lb

## 2024-06-08 DIAGNOSIS — F319 Bipolar disorder, unspecified: Secondary | ICD-10-CM

## 2024-06-08 DIAGNOSIS — F1090 Alcohol use, unspecified, uncomplicated: Secondary | ICD-10-CM

## 2024-06-08 DIAGNOSIS — F1210 Cannabis abuse, uncomplicated: Secondary | ICD-10-CM

## 2024-06-08 DIAGNOSIS — N943 Premenstrual tension syndrome: Secondary | ICD-10-CM

## 2024-06-08 MED ORDER — LAMOTRIGINE 25 MG PO TABS: ORAL_TABLET | ORAL | 1 refills | Status: DC

## 2024-06-08 MED ORDER — HYDROXYZINE HCL 10 MG PO TABS: 10.0000 mg | ORAL_TABLET | Freq: Every evening | ORAL | 0 refills | Status: DC | PRN

## 2024-06-08 NOTE — Progress Notes (Signed)
  Health MD Virtual Progress Note   Patient Location: Home Provider Location: Home Office  I connect with patient by video and verified that I am speaking with correct person by using two identifiers. I discussed the limitations of evaluation and management by telemedicine and the availability of in person appointments. I also discussed with the patient that there may be a patient responsible charge related to this service. The patient expressed understanding and agreed to proceed.  Kelli Parks [id number] 33 y.o.  06/08/2024 9:43 AM  History of Present Illness:  Patient is evaluated by video session.  She reported taking Lamictal  twice a day and she noticed marginal improvement in her mood.  She still have days when she has a lot of irritability, frustration, anger and she took a day off yesterday.  She was also seen in the emergency room few days ago because of abdominal pain.  She has nausea and labs were drawn.  She is now scheduled to have ultrasound and going to see the OB/GYN end of this month.  She reported overall cut down her cannabis use and alcohol because her partner does not drink or do drugs.  She reported poor sleep and noticed around her monthly cycle very irritable, frustrated, anger.  So far tolerating Lamictal  but still have a lot of residual symptoms.  She admitted to smoking marijuana yesterday and drink little bit yesterday.  However overall she has cut down her drinking and smoking marijuana.  She had a good support from her mother, aunt and her father.  Her parents are separated.  She denies any active or passive suicidal thoughts but sometimes she feels dysphoria, ruminative thoughts, irritability.  She reported her partner is very supportive.  They have been together for 10 months.  Patient told they work at the same place.  Her appetite is okay.  Her weight is unchanged from the past.  She is open to try increasing the dose of Lamictal  and also willing  to see a therapist to help her coping skills.  Past Psychiatric History: History of drinking and using cocaine, cannabis use.  History of DUI in 2023.  History of mood swing, anger, highs and lows, road rage, excessive buying and making impulsive decisions.  No history of suicidal attempt, inpatient treatment.  PCP prescribed Abilify  but never picked up from the pharmacy.   Past Medical History:  Diagnosis Date   Allergy    Gastritis    HSV-2 (herpes simplex virus 2) infection     Outpatient Encounter Medications as of 06/08/2024  Medication Sig   lamoTRIgine  (LAMICTAL ) 25 MG tablet Take one tab daily for one week and than two tab daily   naproxen  (NAPROSYN ) 500 MG tablet Take 1 tablet (500 mg total) by mouth 2 (two) times daily.   ondansetron  (ZOFRAN -ODT) 4 MG disintegrating tablet Take 1 tablet (4 mg total) by mouth every 8 (eight) hours as needed for nausea.   QUEtiapine  (SEROQUEL ) 25 MG tablet Take 1-2 tablets (25-50 mg total) by mouth 2 (two) times daily. (Patient not taking: Reported on 05/10/2024)   No facility-administered encounter medications on file as of 06/08/2024.    Recent Results (from the past 2160 hours)  Lipase, blood     Status: None   Collection Time: 05/20/24 10:09 PM  Result Value Ref Range   Lipase 12 11 - 51 U/L    Comment: Performed at [facility], [address] [place], [place] [address]  Comprehensive metabolic panel  Status: Abnormal   Collection Time: 05/20/24 10:09 PM  Result Value Ref Range   Sodium 136 135 - 145 mmol/L   Potassium 3.7 3.5 - 5.1 mmol/L   Chloride 98 98 - 111 mmol/L   CO2 21 (L) 22 - 32 mmol/L   Glucose, Bld 114 (H) 70 - 99 mg/dL    Comment: Glucose reference range applies only to samples taken after fasting for at least 8 hours.   BUN 9 6 - 20 mg/dL   Creatinine, Ser 9.23 0.44 - 1.00 mg/dL   Calcium 9.8 8.9 - 89.6 mg/dL   Total Protein 8.2 (H) 6.5 - 8.1 g/dL   Albumin 4.7 3.5 - 5.0 g/dL   AST 24 15 -  41 U/L   ALT 16 0 - 44 U/L   Alkaline Phosphatase 55 38 - 126 U/L   Total Bilirubin 0.5 0.0 - 1.2 mg/dL   GFR, Estimated >39 >39 mL/min    Comment: (NOTE) Calculated using the CKD-EPI Creatinine Equation (2021)    Anion gap 17 (H) 5 - 15    Comment: Performed at [facility], [address] [place], [place] [address]  CBC     Status: Abnormal   Collection Time: 05/20/24 10:09 PM  Result Value Ref Range   WBC 11.0 (H) 4.0 - 10.5 K/uL   RBC 4.24 3.87 - 5.11 MIL/uL   Hemoglobin 12.2 12.0 - 15.0 g/dL   HCT 63.2 63.9 - 53.9 %   MCV 86.6 80.0 - 100.0 fL   MCH 28.8 26.0 - 34.0 pg   MCHC 33.2 30.0 - 36.0 g/dL   RDW 85.2 88.4 - 84.4 %   Platelets 300 150 - 400 K/uL   nRBC 0.0 0.0 - 0.2 %    Comment: Performed at [facility], [address], [place], [place] [address]  Urinalysis, Routine w reflex microscopic -Urine, Clean Catch     Status: Abnormal   Collection Time: 05/20/24 10:09 PM  Result Value Ref Range   Color, Urine YELLOW YELLOW   APPearance CLEAR CLEAR   Specific Gravity, Urine 1.031 (H) 1.005 - 1.030   pH 7.5 5.0 - 8.0   Glucose, UA NEGATIVE NEGATIVE mg/dL   Hgb urine dipstick NEGATIVE NEGATIVE   Bilirubin Urine NEGATIVE NEGATIVE   Ketones, ur >80 (A) NEGATIVE mg/dL   Protein, ur TRACE (A) NEGATIVE mg/dL   Nitrite NEGATIVE NEGATIVE   Leukocytes,Ua NEGATIVE NEGATIVE    Comment: Performed at [facility], [address], [place], [place] [address]  Pregnancy, urine     Status: None   Collection Time: 05/20/24 10:09 PM  Result Value Ref Range   Preg Test, Ur NEGATIVE NEGATIVE    Comment:        THE SENSITIVITY OF THIS METHODOLOGY IS >20 mIU/mL. Performed at [facility], [address] [place], [place] [address]      Psychiatric Specialty Exam: Physical Exam  Review of Systems  Weight 180 lb (81.6 kg), last menstrual period 05/06/2024.There is no height or weight on file to calculate  BMI.  General Appearance: Casual  Eye Contact:  Fair  Speech:  Normal Rate  Volume:  Normal  Mood:  Dysphoric and Irritable  Affect:  Appropriate  Thought Process:  Descriptions of Associations: Intact  Orientation:  Full (Time, Place, and Person)  Thought Content:  Paranoid Ideation and Rumination  Suicidal Thoughts:  No  Homicidal Thoughts:  No  Memory:  Immediate;   Fair Recent;   Fair  Remote;   Good  Judgement:  Fair  Insight:  Shallow  Psychomotor Activity:  Decreased  Concentration:  Concentration: Fair and Attention Span: Fair  Recall:  [employer] of Knowledge:  Fair  Language:  Good  Akathisia:  No  Handed:  Right  AIMS (if indicated):     Assets:  Communication Skills Desire for Improvement Housing Talents/Skills Transportation  ADL's:  Intact  Cognition:  WNL  Sleep:  fair       03/08/2024    8:39 AM 06/30/2023   10:05 AM 01/17/2023   11:06 AM 07/03/2021    4:14 PM 06/06/2021    3:39 PM  Depression screen PHQ 2/9  Decreased Interest 2 2 2 1 1   Down, Depressed, Hopeless 2 2 3 1 1   PHQ - 2 Score 4 4 5 2 2   Altered sleeping 2 1 2 2 3   Tired, decreased energy 2 1 3 1 1   Change in appetite 0 0 2 0 0  Feeling bad or failure about yourself  0 0 1 0 1  Trouble concentrating 2 1 1 2 1   Moving slowly or fidgety/restless 3 3 2 1 2   Suicidal thoughts 0 0 0 0 0  PHQ-9 Score 13 10 16 8 10   Difficult doing work/chores Somewhat difficult Very difficult  Not difficult at all     Assessment/Plan: Bipolar I disorder (HCC) - Plan: lamoTRIgine  (LAMICTAL ) 25 MG tablet, hydrOXYzine (ATARAX) 10 MG tablet  Alcohol use - Plan: lamoTRIgine  (LAMICTAL ) 25 MG tablet, hydrOXYzine (ATARAX) 10 MG tablet  PMS (premenstrual syndrome) - Plan: hydrOXYzine (ATARAX) 10 MG tablet  Mild tetrahydrocannabinol (THC) abuse - Plan: hydrOXYzine (ATARAX) 10 MG tablet  Patient is 33 year old African-American employed female with history of bipolar disorder, alcohol use, cannabis use and also  reported premenstrual syndrome.  Reviewed blood work results and collateral information from recent emergency room visit.  Reviewed labs and current medication.  She has appointment for ultrasound and also to see OB/GYN.  Discussed to trial Lamictal  75 mg since she noticed some improvement in her mood.  She also not sleeping very well, will trial low-dose hydroxyzine as patient does not want very heavy dose or strong medication because next she works.  I also encouraged to discuss with OB/GYN about her premenstrual syndrome.  She has no rash or any itching.  We will also refer her for therapy.  Discussed stopping the cannabis and alcohol due to interaction of drugs with the psychotropic medication.  Will follow-up in 4 to 6 weeks.  She is not taking Seroquel .   Follow Up Instructions:     I discussed the assessment and treatment plan with the patient. The patient was provided an opportunity to ask questions and all were answered. The patient agreed with the plan and demonstrated an understanding of the instructions.   The patient was advised to call back or seek an in-person evaluation if the symptoms worsen or if the condition fails to improve as anticipated.    Collaboration of Care: Other provider involved in patient's care AEB notes are available in epic to review  Patient/Guardian was advised Release of Information must be obtained prior to any record release in order to collaborate their care with an outside provider. Patient/Guardian was advised if they have not already done so to contact the registration department to sign all necessary forms in order for us  to release information regarding their care.   Consent: Patient/Guardian gives verbal consent for treatment and assignment of benefits  for services provided during this visit. Patient/Guardian expressed understanding and agreed to proceed.     Total encounter time 30 minutes which includes face-to-face time, chart reviewed, care  coordination, order entry and documentation during this encounter.   Note: This document was prepared by [employer] voice dictation technology and any errors that results from this process are unintentional.    Leni ONEIDA Client, MD 06/08/2024

## 2024-07-06 ENCOUNTER — Telehealth (HOSPITAL_BASED_OUTPATIENT_CLINIC_OR_DEPARTMENT_OTHER): Payer: Self-pay | Admitting: Psychiatry

## 2024-07-06 ENCOUNTER — Encounter (HOSPITAL_COMMUNITY): Payer: Self-pay | Admitting: Psychiatry

## 2024-07-06 VITALS — Wt 185.0 lb

## 2024-07-06 DIAGNOSIS — F319 Bipolar disorder, unspecified: Secondary | ICD-10-CM

## 2024-07-06 DIAGNOSIS — F1090 Alcohol use, unspecified, uncomplicated: Secondary | ICD-10-CM

## 2024-07-06 DIAGNOSIS — N943 Premenstrual tension syndrome: Secondary | ICD-10-CM

## 2024-07-06 DIAGNOSIS — F1210 Cannabis abuse, uncomplicated: Secondary | ICD-10-CM

## 2024-07-06 MED ORDER — LAMOTRIGINE 100 MG PO TABS: 100.0000 mg | ORAL_TABLET | Freq: Every day | ORAL | 1 refills | Status: DC

## 2024-07-06 MED ORDER — HYDROXYZINE HCL 10 MG PO TABS: 10.0000 mg | ORAL_TABLET | Freq: Every evening | ORAL | 1 refills | Status: DC | PRN

## 2024-07-06 NOTE — Progress Notes (Signed)
 [facility] MD Virtual Progress Note   Patient Location: Home Provider Location: Home Office  I connect with patient by video and verified that I am speaking with correct person by using two identifiers. I discussed the limitations of evaluation and management by telemedicine and the availability of in person appointments. I also discussed with the patient that there may be a patient responsible charge related to this service. The patient expressed understanding and agreed to proceed.  Kelli Parks [id number] 33 y.o.  07/06/2024 9:46 AM  History of Present Illness:  Patient is evaluated by video session.  She reported Lamictal  helping her mood lability irritability anger frustration and she is controlling her emotions better than before.  She also cut down her cannabis use and drinking.  Now she is drinking only on the weekends but denies any intoxication, thanks.  She reported her partner also noticed improvement in her mood.  She still have irritability for her monthly cycle.  She supposed to see OB/GYN however has not scheduled appointment yet.  She has no rash or any itching.  She has eczema but had not noticed any flareup.  She had a good support from her mother, aunt.  Her parents are separated.  She reported her job is going well.  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  Patient denies any hallucination, paranoia, suicidal thoughts.  She reported a better relationship with her partner.  She denies any panic attack.  Denies any active or passive suicidal thoughts or homicidal thoughts.  Her long-term plan is to sober from drinking and cannabis use.  Her appetite is okay.  Her weight is stable.  Past Psychiatric History: History of drinking and using cocaine, cannabis use.  History of DUI in 2023.  History of mood swing, anger, highs and lows, road rage, excessive buying and making impulsive decisions.  No history of  suicidal attempt, inpatient treatment.  PCP prescribed Abilify  but never picked up from the pharmacy.   Past Medical History:  Diagnosis Date   Allergy    Gastritis    HSV-2 (herpes simplex virus 2) infection     Outpatient Encounter Medications as of 07/06/2024  Medication Sig   hydrOXYzine (ATARAX) 10 MG tablet Take 1-2 tablets (10-20 mg total) by mouth at bedtime and may repeat dose one time if needed.   lamoTRIgine  (LAMICTAL ) 25 MG tablet Take three one tab daily   naproxen  (NAPROSYN ) 500 MG tablet Take 1 tablet (500 mg total) by mouth 2 (two) times daily.   ondansetron  (ZOFRAN -ODT) 4 MG disintegrating tablet Take 1 tablet (4 mg total) by mouth every 8 (eight) hours as needed for nausea.   No facility-administered encounter medications on file as of 07/06/2024.    Recent Results (from the past 2160 hours)  Lipase, blood     Status: None   Collection Time: 05/20/24 10:09 PM  Result Value Ref Range   Lipase 12 11 - 51 U/L    Comment: Performed at [facility], [address], [place], [place] [address]  Comprehensive metabolic panel     Status: Abnormal   Collection Time: 05/20/24 10:09 PM  Result Value Ref Range   Sodium 136 135 - 145 mmol/L   Potassium 3.7 3.5 - 5.1 mmol/L   Chloride 98 98 - 111 mmol/L   CO2 21 (L) 22 - 32 mmol/L   Glucose, Bld 114 (H) 70 - 99 mg/dL  Comment: Glucose reference range applies only to samples taken after fasting for at least 8 hours.   BUN 9 6 - 20 mg/dL   Creatinine, Ser 9.23 0.44 - 1.00 mg/dL   Calcium 9.8 8.9 - 89.6 mg/dL   Total Protein 8.2 (H) 6.5 - 8.1 g/dL   Albumin 4.7 3.5 - 5.0 g/dL   AST 24 15 - 41 U/L   ALT 16 0 - 44 U/L   Alkaline Phosphatase 55 38 - 126 U/L   Total Bilirubin 0.5 0.0 - 1.2 mg/dL   GFR, Estimated >39 >39 mL/min    Comment: (NOTE) Calculated using the CKD-EPI Creatinine Equation (2021)    Anion gap 17 (H) 5 - 15    Comment: Performed at [facility], [address] [place], [place] [address]  CBC     Status: Abnormal   Collection Time: 05/20/24 10:09 PM  Result Value Ref Range   WBC 11.0 (H) 4.0 - 10.5 K/uL   RBC 4.24 3.87 - 5.11 MIL/uL   Hemoglobin 12.2 12.0 - 15.0 g/dL   HCT 63.2 63.9 - 53.9 %   MCV 86.6 80.0 - 100.0 fL   MCH 28.8 26.0 - 34.0 pg   MCHC 33.2 30.0 - 36.0 g/dL   RDW 85.2 88.4 - 84.4 %   Platelets 300 150 - 400 K/uL   nRBC 0.0 0.0 - 0.2 %    Comment: Performed at [facility], [address] [place], [place] [address]  Urinalysis, Routine w reflex microscopic -Urine, Clean Catch     Status: Abnormal   Collection Time: 05/20/24 10:09 PM  Result Value Ref Range   Color, Urine YELLOW YELLOW   APPearance CLEAR CLEAR   Specific Gravity, Urine 1.031 (H) 1.005 - 1.030   pH 7.5 5.0 - 8.0   Glucose, UA NEGATIVE NEGATIVE mg/dL   Hgb urine dipstick NEGATIVE NEGATIVE   Bilirubin Urine NEGATIVE NEGATIVE   Ketones, ur >80 (A) NEGATIVE mg/dL   Protein, ur TRACE (A) NEGATIVE mg/dL   Nitrite NEGATIVE NEGATIVE   Leukocytes,Ua NEGATIVE NEGATIVE    Comment: Performed at [facility], [address] [place], [place] [address]  Pregnancy, urine     Status: None   Collection Time: 05/20/24 10:09 PM  Result Value Ref Range   Preg Test, Ur NEGATIVE NEGATIVE    Comment:        THE SENSITIVITY OF THIS METHODOLOGY IS >20 mIU/mL. Performed at [facility], [address] [place], [place] [address]      Psychiatric Specialty Exam: Physical Exam  Review of Systems  Weight 185 lb (83.9 kg).There is no height or weight on file to calculate BMI.  General Appearance: Casual  Eye Contact:  Good  Speech:  Normal Rate  Volume:  Normal  Mood:  Euthymic  Affect:  Congruent  Thought Process:  Goal Directed  Orientation:  Full (Time, Place, and Person)  Thought Content:  WDL  Suicidal Thoughts:  No  Homicidal Thoughts:  No  Memory:  Immediate;   Fair Recent;   Fair Remote;   Good   Judgement:  Fair  Insight:  Shallow  Psychomotor Activity:  Increased  Concentration:  Concentration: Fair and Attention Span: Fair  Recall:  Good  Fund of Knowledge:  Good  Language:  Fair  Akathisia:  Yes  Handed:  Right  AIMS (if indicated):     Assets:  Communication Skills Desire for Improvement Housing Social Support Talents/Skills Transportation  ADL's:  Intact  Cognition:  WNL  Sleep:  better       03/08/2024    8:39 AM 06/30/2023   10:05 AM 01/17/2023   11:06 AM 07/03/2021    4:14 PM 06/06/2021    3:39 PM  Depression screen PHQ 2/9  Decreased Interest 2 2 2 1 1   Down, Depressed, Hopeless 2 2 3 1 1   PHQ - 2 Score 4 4 5 2 2   Altered sleeping 2 1 2 2 3   Tired, decreased energy 2 1 3 1 1   Change in appetite 0 0 2 0 0  Feeling bad or failure about yourself  0 0 1 0 1  Trouble concentrating 2 1 1 2 1   Moving slowly or fidgety/restless 3 3 2 1 2   Suicidal thoughts 0 0 0 0 0  PHQ-9 Score 13  10  16  8  10    Difficult doing work/chores Somewhat difficult Very difficult  Not difficult at all      Data saved with a previous flowsheet row definition    Assessment/Plan: Bipolar I disorder (HCC) - Plan: hydrOXYzine (ATARAX) 10 MG tablet, lamoTRIgine  (LAMICTAL ) 100 MG tablet  Alcohol use - Plan: hydrOXYzine (ATARAX) 10 MG tablet, lamoTRIgine  (LAMICTAL ) 100 MG tablet  PMS (premenstrual syndrome) - Plan: hydrOXYzine (ATARAX) 10 MG tablet  Mild tetrahydrocannabinol (THC) abuse - Plan: hydrOXYzine (ATARAX) 10 MG tablet  Patient is a 33 year old African-American employed female with history of bipolar disorder, alcohol use, cannabis use and also reported premenstrual syndrome.  She is doing better than before and cut down her drinking and alcohol.  Her PMS symptoms still persist but less intense.  Encouraged to see OB/GYN.  Recommend to try with Lamictal  100 mg since it is helping her mood symptoms.  She like hydroxyzine which is helping her sleep and anxiety.  Will try  Lamictal  100 mg and recommend to watch carefully if she has any rash or any itching.  She has appointment coming up to see her therapist Ms. Edwena in January and I recommend to keep that appointment.  Discussed safety concerns at any time having active suicidal thoughts or homicidal thoughts and she need to call 911 or go to local emergency room.  Follow-up in 2 months.   Follow Up Instructions:     I discussed the assessment and treatment plan with the patient. The patient was provided an opportunity to ask questions and all were answered. The patient agreed with the plan and demonstrated an understanding of the instructions.   The patient was advised to call back or seek an in-person evaluation if the symptoms worsen or if the condition fails to improve as anticipated.    Collaboration of Care: Other provider involved in patient's care AEB notes are available in epic to review  Patient/Guardian was advised Release of Information must be obtained prior to any record release in order to collaborate their care with an outside provider. Patient/Guardian was advised if they have not already done so to contact the registration department to sign all necessary forms in order for us  to release information regarding their care.   Consent: Patient/Guardian gives verbal consent for treatment and assignment of benefits for services provided during this visit. Patient/Guardian expressed understanding and agreed to proceed.     Total encounter time 17 minutes which includes face-to-face time, chart reviewed, care coordination, order entry and documentation during this encounter.   Note: This document was prepared by [employer] and any errors that results from  this process are unintentional.    Leni ONEIDA Client, MD 07/06/2024

## 2024-07-11 ENCOUNTER — Other Ambulatory Visit: Payer: Self-pay

## 2024-07-11 ENCOUNTER — Emergency Department (HOSPITAL_BASED_OUTPATIENT_CLINIC_OR_DEPARTMENT_OTHER)
Admission: EM | Admit: 2024-07-11 | Discharge: 2024-07-11 | Disposition: A | Discharge status: Home / Self Care | Payer: PRIVATE HEALTH INSURANCE | Attending: Emergency Medicine | Admitting: Emergency Medicine

## 2024-07-11 ENCOUNTER — Encounter (HOSPITAL_BASED_OUTPATIENT_CLINIC_OR_DEPARTMENT_OTHER): Payer: Self-pay | Admitting: Emergency Medicine

## 2024-07-11 DIAGNOSIS — R1084 Generalized abdominal pain: Secondary | ICD-10-CM | POA: Diagnosis present

## 2024-07-11 DIAGNOSIS — R079 Chest pain, unspecified: Secondary | ICD-10-CM | POA: Diagnosis not present

## 2024-07-11 DIAGNOSIS — R1116 Cannabis hyperemesis syndrome: Secondary | ICD-10-CM | POA: Insufficient documentation

## 2024-07-11 LAB — CBC WITH DIFFERENTIAL/PLATELET
Abs Immature Granulocytes: 0.03 K/uL (ref 0.00–0.07)
Basophils Absolute: 0 K/uL (ref 0.0–0.1)
Basophils Relative: 0 %
Eosinophils Absolute: 0 K/uL (ref 0.0–0.5)
Eosinophils Relative: 0 %
HCT: 36.9 % (ref 36.0–46.0)
Hemoglobin: 12.3 g/dL (ref 12.0–15.0)
Immature Granulocytes: 0 %
Lymphocytes Relative: 7 %
Lymphs Abs: 0.8 K/uL (ref 0.7–4.0)
MCH: 29.2 pg (ref 26.0–34.0)
MCHC: 33.3 g/dL (ref 30.0–36.0)
MCV: 87.6 fL (ref 80.0–100.0)
Monocytes Absolute: 0.4 K/uL (ref 0.1–1.0)
Monocytes Relative: 3 %
Neutro Abs: 9.7 K/uL — ABNORMAL HIGH (ref 1.7–7.7)
Neutrophils Relative %: 90 %
Platelets: 354 K/uL (ref 150–400)
RBC: 4.21 MIL/uL (ref 3.87–5.11)
RDW: 14.5 % (ref 11.5–15.5)
WBC: 11 K/uL — ABNORMAL HIGH (ref 4.0–10.5)
nRBC: 0 % (ref 0.0–0.2)

## 2024-07-11 LAB — COMPREHENSIVE METABOLIC PANEL WITH GFR
ALT: 17 U/L (ref 0–44)
AST: 24 U/L (ref 15–41)
Albumin: 5 g/dL (ref 3.5–5.0)
Alkaline Phosphatase: 62 U/L (ref 38–126)
Anion gap: 15 (ref 5–15)
BUN: 7 mg/dL (ref 6–20)
CO2: 22 mmol/L (ref 22–32)
Calcium: 9.9 mg/dL (ref 8.9–10.3)
Chloride: 99 mmol/L (ref 98–111)
Creatinine, Ser: 0.78 mg/dL (ref 0.44–1.00)
GFR, Estimated: >60 mL/min (ref >60)
Glucose, Bld: 125 mg/dL — ABNORMAL HIGH (ref 70–99)
Potassium: 3.9 mmol/L (ref 3.5–5.1)
Sodium: 136 mmol/L (ref 135–145)
Total Bilirubin: 0.5 mg/dL (ref 0.0–1.2)
Total Protein: 8.6 g/dL — ABNORMAL HIGH (ref 6.5–8.1)

## 2024-07-11 LAB — HCG, SERUM, QUALITATIVE: Preg, Serum: NEGATIVE

## 2024-07-11 LAB — LIPASE, BLOOD: Lipase: 12 U/L (ref 11–51)

## 2024-07-11 MED ORDER — DROPERIDOL 2.5 MG/ML IJ SOLN
1.2500 mg | Freq: Once | INTRAMUSCULAR | Status: AC
  Administered 2024-07-11: 1.25 mg via INTRAVENOUS
  Filled 2024-07-11: qty 2

## 2024-07-11 MED ORDER — PROCHLORPERAZINE EDISYLATE 10 MG/2ML IJ SOLN
10.0000 mg | Freq: Once | INTRAMUSCULAR | Status: AC
  Administered 2024-07-11: 10 mg via INTRAVENOUS
  Filled 2024-07-11: qty 2

## 2024-07-11 MED ORDER — LACTATED RINGERS IV BOLUS
1000.0000 mL | Freq: Once | INTRAVENOUS | Status: AC
  Administered 2024-07-11: 1000 mL via INTRAVENOUS

## 2024-07-11 MED ORDER — ALUM & MAG HYDROXIDE-SIMETH 200-200-20 MG/5ML PO SUSP
30.0000 mL | Freq: Once | ORAL | Status: AC
  Administered 2024-07-11: 30 mL via ORAL
  Filled 2024-07-11: qty 30

## 2024-07-11 MED ORDER — PROCHLORPERAZINE MALEATE 10 MG PO TABS: 10.0000 mg | ORAL_TABLET | Freq: Two times a day (BID) | ORAL | 0 refills | Status: AC | PRN

## 2024-07-11 NOTE — ED Triage Notes (Addendum)
  Patient comes in with mid chest pain and abdominal pain that has been going on since 1700 yesterday.  Patient states she started with abdominal pain and emesis, then had pain in her upper chest.  Hx marijuana use.  Pain 8/10, aching/sharp.  States 10 or more episodes of emesis.  Took alka seltzer earlier in the evening.

## 2024-07-11 NOTE — ED Provider Notes (Signed)
 Kelli Parks  Provider Note  CSN: [id number] Arrival date & time: 07/11/24 0115  History Chief Complaint  Patient presents with   Chest Pain   Abdominal Pain    Kelli Parks is a 33 y.o. female with history of recurrent abdominal pain/vomiting, seen in the ED several times in recent months with mostly negative workup continues to use THC frequently. She reports she has some abdominal discomfort most days but more severe in the last 24 hours with vomiting/retching and now chest discomfort as well.    Home Medications Prior to Admission medications   Medication Sig Start Date End Date Taking? Authorizing Provider  prochlorperazine  (COMPAZINE ) 10 MG tablet Take 1 tablet (10 mg total) by mouth 2 (two) times daily as needed for nausea or vomiting. 07/11/24  Yes Roselyn Carlin NOVAK, MD  hydrOXYzine  (ATARAX ) 10 MG tablet Take 1-2 tablets (10-20 mg total) by mouth at bedtime and may repeat dose one time if needed. 07/06/24   Arfeen, Leni DASEN, MD  lamoTRIgine  (LAMICTAL ) 100 MG tablet Take 1 tablet (100 mg total) by mouth daily. 07/06/24   Arfeen, Leni DASEN, MD  naproxen  (NAPROSYN ) 500 MG tablet Take 1 tablet (500 mg total) by mouth 2 (two) times daily. 08/29/23   Piontek, Rocky, MD  ondansetron  (ZOFRAN -ODT) 4 MG disintegrating tablet Take 1 tablet (4 mg total) by mouth every 8 (eight) hours as needed for nausea. 12/24/23   Hoy Nidia FALCON, PA-C     Allergies    Patient has no known allergies.   Review of Systems   Review of Systems Please see HPI for pertinent positives and negatives  Physical Exam BP 115/62   Pulse 63   Temp 98.3 F (36.8 C) (Oral)   Resp (!) 21   Ht 5' 3 (1.6 m)   Wt 83.9 kg   LMP 07/10/2024 (Approximate)   SpO2 98%   BMI 32.77 kg/m   Physical Exam Vitals and nursing note reviewed.  Constitutional:      Appearance: Normal appearance.  HENT:     Head: Normocephalic and atraumatic.     Nose: Nose normal.      Mouth/Throat:     Mouth: Mucous membranes are moist.  Eyes:     Extraocular Movements: Extraocular movements intact.     Conjunctiva/sclera: Conjunctivae normal.  Cardiovascular:     Rate and Rhythm: Normal rate.  Pulmonary:     Effort: Pulmonary effort is normal.     Breath sounds: Normal breath sounds.  Abdominal:     General: Abdomen is flat.     Palpations: Abdomen is soft.     Tenderness: There is generalized abdominal tenderness. There is no guarding. Negative signs include Murphy's sign and McBurney's sign.  Musculoskeletal:        General: No swelling. Normal range of motion.     Cervical back: Neck supple.  Skin:    General: Skin is warm and dry.  Neurological:     General: No focal deficit present.     Mental Status: She is alert.  Psychiatric:        Mood and Affect: Mood normal.     ED Results / Procedures / Treatments   EKG EKG Interpretation Date/Time:  Sunday July 11 2024 01:28:55 EST Ventricular Rate:  59 PR Interval:  164 QRS Duration:  84 QT Interval:  473 QTC Calculation: 469 R Axis:   51  Text Interpretation: Sinus rhythm Otherwise within normal limits No significant  change since last tracing Confirmed by Roselyn Dunnings [phone]) on 07/11/2024 1:40:00 AM  Procedures Procedures  Medications Ordered in the ED Medications  lactated ringers  bolus 1,000 mL (0 mLs Intravenous Stopped 07/11/24 0250)  droperidol  (INAPSINE ) 2.5 MG/ML injection 1.25 mg (1.25 mg Intravenous Given 07/11/24 0149)  alum & mag hydroxide-simeth (MAALOX/MYLANTA) 200-200-20 MG/5ML suspension 30 mL (30 mLs Oral Given 07/11/24 0151)  prochlorperazine  (COMPAZINE ) injection 10 mg (10 mg Intravenous Given 07/11/24 0221)    Initial Impression and Plan  Patient here for recurrence of abdominal pain and vomiting. She has had extensive ED workup in the past. Some tenderness on exam but no peritoneal signs. Will check labs, give droperidol  and IVF. Patient counseled to stop all THC  use.   ED Course   Clinical Course as of 07/11/24 0304  Sun Jul 11, 2024  0154 CBC, CMP, lipase and HCG are neg.  [CS]  0218 Patient reports no improvement after droperidol . Will give a dose of Compazine  and reassess.  [CS]  0303 Patient reports symptoms resolved, feeling better and ready to go home. Recommend cessation of THC. Rx for compazine , PCP follow up, RTED for any other concerns.   [CS]    Clinical Course User Index [CS] Roselyn Dunnings NOVAK, MD     MDM Rules/Calculators/A&P Medical Decision Making Given presenting complaint, I considered that admission might be necessary. After review of results from ED lab and/or imaging studies, admission to the [facility] is not indicated at this time.    Problems Addressed: Cannabinoid hyperemesis syndrome: acute illness or injury  Amount and/or Complexity of Data Reviewed Labs: ordered. Decision-making details documented in ED Course.  Risk OTC drugs. Prescription drug management. Decision regarding hospitalization.     Final Clinical Impression(s) / ED Diagnoses Final diagnoses:  Cannabinoid hyperemesis syndrome    Rx / DC Orders ED Discharge Orders          Ordered    prochlorperazine  (COMPAZINE ) 10 MG tablet  2 times daily PRN        07/11/24 0304             Roselyn Dunnings NOVAK, MD 07/11/24 [phone]

## 2024-08-23 ENCOUNTER — Encounter (HOSPITAL_COMMUNITY): Payer: Self-pay | Admitting: *Deleted

## 2024-09-01 ENCOUNTER — Ambulatory Visit (HOSPITAL_COMMUNITY): Payer: Self-pay | Admitting: Clinical

## 2024-09-07 ENCOUNTER — Encounter (HOSPITAL_COMMUNITY): Payer: Self-pay | Admitting: Psychiatry

## 2024-09-07 ENCOUNTER — Telehealth (HOSPITAL_COMMUNITY): Payer: Self-pay | Admitting: Psychiatry

## 2024-09-07 VITALS — Wt 185.0 lb

## 2024-09-07 DIAGNOSIS — F319 Bipolar disorder, unspecified: Secondary | ICD-10-CM

## 2024-09-07 DIAGNOSIS — F1210 Cannabis abuse, uncomplicated: Secondary | ICD-10-CM | POA: Diagnosis not present

## 2024-09-07 DIAGNOSIS — F1090 Alcohol use, unspecified, uncomplicated: Secondary | ICD-10-CM | POA: Diagnosis not present

## 2024-09-07 MED ORDER — HYDROXYZINE HCL 10 MG PO TABS: 10.0000 mg | ORAL_TABLET | Freq: Every evening | ORAL | 2 refills | Status: AC | PRN

## 2024-09-07 MED ORDER — LAMOTRIGINE 100 MG PO TABS: 100.0000 mg | ORAL_TABLET | Freq: Every day | ORAL | 2 refills | Status: AC

## 2024-09-07 NOTE — Progress Notes (Signed)
 " [facility] MD Virtual Progress Note   Patient Location: Home Provider Location: Home Office  I connect with patient by video and verified that I am speaking with correct person by using two identifiers. I discussed the limitations of evaluation and management by telemedicine and the availability of in person appointments. I also discussed with the patient that there may be a patient responsible charge related to this service. The patient expressed understanding and agreed to proceed.  Kelli Parks [id number] 34 y.o.  09/07/2024 11:15 AM  History of Present Illness:  Patient is evaluated by video session.  We increased the Lamictal  on the last session and she is tolerating very well.  She denies any rash or itching.  She cut down her cannabis use and drinking but still it is challenging and she is working on it.  She is looking forward to have a first therapy visit this Thursday with Ms. Edwena.  She admitted stress in her relationship.  Her partner now moved in with her but now patient realize it is too soon.  She reported job is going okay.  She has eczema but not flared up.  She had good support from her mother and aunt.  She had good Christmas.  Her parents are separated.  She denies any mania, hallucination, psychosis or any suicidal thoughts.  Her sleep is good with the help of hydroxyzine .  Her plan is to stop the cannabis use and drinking.  She had 1 drink since the last visit.  She wants to continue Lamictal  and hydroxyzine .  In November she was in the emergency room for abdominal pain and she reported everything was normal.  Labs are normal.  She took some laxative and abdominal pain got better and since then she has no longer abdominal pain.  Past Psychiatric History: History of drinking and using cocaine, cannabis use.  History of DUI in 2023.  History of mood swing, anger, highs and lows, road rage, excessive buying and making impulsive decisions.  No history of  suicidal attempt, inpatient treatment.  PCP prescribed Abilify  but never picked up from the pharmacy.   Past Medical History:  Diagnosis Date   Allergy    Gastritis    HSV-2 (herpes simplex virus 2) infection     Outpatient Encounter Medications as of 09/07/2024  Medication Sig   hydrOXYzine  (ATARAX ) 10 MG tablet Take 1-2 tablets (10-20 mg total) by mouth at bedtime and may repeat dose one time if needed.   lamoTRIgine  (LAMICTAL ) 100 MG tablet Take 1 tablet (100 mg total) by mouth daily.   naproxen  (NAPROSYN ) 500 MG tablet Take 1 tablet (500 mg total) by mouth 2 (two) times daily.   ondansetron  (ZOFRAN -ODT) 4 MG disintegrating tablet Take 1 tablet (4 mg total) by mouth every 8 (eight) hours as needed for nausea.   prochlorperazine  (COMPAZINE ) 10 MG tablet Take 1 tablet (10 mg total) by mouth 2 (two) times daily as needed for nausea or vomiting.   No facility-administered encounter medications on file as of 09/07/2024.    Recent Results (from the past 2160 hours)  Comprehensive metabolic panel     Status: Abnormal   Collection Time: 07/11/24  1:25 AM  Result Value Ref Range   Sodium 136 135 - 145 mmol/L   Potassium 3.9 3.5 - 5.1 mmol/L   Chloride 99 98 - 111 mmol/L   CO2 22 22 - 32 mmol/L   Glucose, Bld 125 (H) 70 - 99 mg/dL    Comment:  Glucose reference range applies only to samples taken after fasting for at least 8 hours.   BUN 7 6 - 20 mg/dL   Creatinine, Ser 9.21 0.44 - 1.00 mg/dL   Calcium 9.9 8.9 - 89.6 mg/dL   Total Protein 8.6 (H) 6.5 - 8.1 g/dL   Albumin 5.0 3.5 - 5.0 g/dL   AST 24 15 - 41 U/L   ALT 17 0 - 44 U/L   Alkaline Phosphatase 62 38 - 126 U/L   Total Bilirubin 0.5 0.0 - 1.2 mg/dL   GFR, Estimated >39 >39 mL/min    Comment: (NOTE) Calculated using the CKD-EPI Creatinine Equation (2021)    Anion gap 15 5 - 15    Comment: Performed at [facility], [address], [place], [place] [address]  Lipase, blood     Status: None   Collection  Time: 07/11/24  1:25 AM  Result Value Ref Range   Lipase 12 11 - 51 U/L    Comment: Performed at [facility], [address] [place], [place] [address]  CBC with Differential     Status: Abnormal   Collection Time: 07/11/24  1:25 AM  Result Value Ref Range   WBC 11.0 (H) 4.0 - 10.5 K/uL   RBC 4.21 3.87 - 5.11 MIL/uL   Hemoglobin 12.3 12.0 - 15.0 g/dL   HCT 63.0 63.9 - 53.9 %   MCV 87.6 80.0 - 100.0 fL   MCH 29.2 26.0 - 34.0 pg   MCHC 33.3 30.0 - 36.0 g/dL   RDW 85.4 88.4 - 84.4 %   Platelets 354 150 - 400 K/uL   nRBC 0.0 0.0 - 0.2 %   Neutrophils Relative % 90 %   Neutro Abs 9.7 (H) 1.7 - 7.7 K/uL   Lymphocytes Relative 7 %   Lymphs Abs 0.8 0.7 - 4.0 K/uL   Monocytes Relative 3 %   Monocytes Absolute 0.4 0.1 - 1.0 K/uL   Eosinophils Relative 0 %   Eosinophils Absolute 0.0 0.0 - 0.5 K/uL   Basophils Relative 0 %   Basophils Absolute 0.0 0.0 - 0.1 K/uL   Immature Granulocytes 0 %   Abs Immature Granulocytes 0.03 0.00 - 0.07 K/uL    Comment: Performed at [facility], [address], [place], [place] [address]  hCG, serum, qualitative     Status: None   Collection Time: 07/11/24  1:25 AM  Result Value Ref Range   Preg, Serum NEGATIVE NEGATIVE    Comment:        THE SENSITIVITY OF THIS METHODOLOGY IS >10 mIU/mL. Performed at [facility], [address] [place], [place] [address]      Psychiatric Specialty Exam: Physical Exam  Review of Systems  Weight 185 lb (83.9 kg).There is no height or weight on file to calculate BMI.  General Appearance: Casual  Eye Contact:  Good  Speech:  Normal Rate  Volume:  Normal  Mood:  Euthymic  Affect:  Congruent  Thought Process:  Goal Directed  Orientation:  Full (Time, Place, and Person)  Thought Content:  WDL  Suicidal Thoughts:  No  Homicidal Thoughts:  No  Memory:  Immediate;   Fair Recent;   Fair Remote;   Good  Judgement:  Fair  Insight:  Shallow   Psychomotor Activity:  Normal  Concentration:  Concentration: Fair and Attention Span: Fair  Recall:  Good  Fund of Knowledge:  Good  Language:  Fair  Akathisia:  Yes  Handed:  Right  AIMS (if indicated):     Assets:  Communication Skills Desire for Improvement Housing Social Support Talents/Skills Transportation  ADL's:  Intact  Cognition:  WNL  Sleep: Okay with hydroxyzine        03/08/2024    8:39 AM 06/30/2023   10:05 AM 01/17/2023   11:06 AM 07/03/2021    4:14 PM 06/06/2021    3:39 PM  Depression screen PHQ 2/9  Decreased Interest 2 2 2 1 1   Down, Depressed, Hopeless 2 2 3 1 1   PHQ - 2 Score 4 4 5 2 2   Altered sleeping 2 1 2 2 3   Tired, decreased energy 2 1 3 1 1   Change in appetite 0 0 2 0 0  Feeling bad or failure about yourself  0 0 1 0 1  Trouble concentrating 2 1 1 2 1   Moving slowly or fidgety/restless 3 3 2 1 2   Suicidal thoughts 0 0 0 0 0  PHQ-9 Score 13  10  16  8  10    Difficult doing work/chores Somewhat difficult Very difficult  Not difficult at all      Data saved with a previous flowsheet row definition    Assessment/Plan: Bipolar I disorder (HCC) - Plan: hydrOXYzine  (ATARAX ) 10 MG tablet, lamoTRIgine  (LAMICTAL ) 100 MG tablet  Alcohol use - Plan: hydrOXYzine  (ATARAX ) 10 MG tablet, lamoTRIgine  (LAMICTAL ) 100 MG tablet  Mild tetrahydrocannabinol (THC) abuse - Plan: hydrOXYzine  (ATARAX ) 10 MG tablet, lamoTRIgine  (LAMICTAL ) 100 MG tablet  Patient is a 34 year old African-American employed female with bipolar disorder, alcohol use and mild cannabis use.  She is doing better on higher dose of Lamictal  which was increased on the last visit.  Overall she had cut down her drinking and cannabis use.  She is looking forward to have her first visit with the therapist this Thursday.  Discussed psychosocial stressor as patient not sure about current relationship.  She like to discuss with her therapist about her stressors.  Continue Lamictal  100 mg daily and  hydroxyzine  to take as needed for sleep.  Recommended to call back if she is any question or any concern.  Follow-up in 3 months.  Follow Up Instructions:     I discussed the assessment and treatment plan with the patient. The patient was provided an opportunity to ask questions and all were answered. The patient agreed with the plan and demonstrated an understanding of the instructions.   The patient was advised to call back or seek an in-person evaluation if the symptoms worsen or if the condition fails to improve as anticipated.    Collaboration of Care: Other provider involved in patient's care AEB notes are available in epic to review  Patient/Guardian was advised Release of Information must be obtained prior to any record release in order to collaborate their care with an outside provider. Patient/Guardian was advised if they have not already done so to contact the registration department to sign all necessary forms in order for us  to release information regarding their care.   Consent: Patient/Guardian gives verbal consent for treatment and assignment of benefits for services provided during this visit. Patient/Guardian expressed understanding and agreed to proceed.     Total encounter time 18 minutes which includes face-to-face time, chart reviewed, care coordination, order entry and documentation during this encounter.   Note: This document was prepared by [employer] voice dictation technology and any errors that results from this process are unintentional.    Leni ONEIDA Client, MD 09/07/2024   "

## 2024-09-09 ENCOUNTER — Ambulatory Visit (INDEPENDENT_AMBULATORY_CARE_PROVIDER_SITE_OTHER): Payer: Self-pay | Admitting: Clinical

## 2024-09-09 DIAGNOSIS — F1090 Alcohol use, unspecified, uncomplicated: Secondary | ICD-10-CM

## 2024-09-09 DIAGNOSIS — F319 Bipolar disorder, unspecified: Secondary | ICD-10-CM | POA: Diagnosis not present

## 2024-09-09 DIAGNOSIS — F1210 Cannabis abuse, uncomplicated: Secondary | ICD-10-CM | POA: Diagnosis not present

## 2024-09-09 DIAGNOSIS — F1491 Cocaine use, unspecified, in remission: Secondary | ICD-10-CM | POA: Diagnosis not present

## 2024-09-11 ENCOUNTER — Encounter (HOSPITAL_COMMUNITY): Payer: Self-pay | Admitting: Clinical
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
## 2024-09-22 ENCOUNTER — Other Ambulatory Visit (HOSPITAL_COMMUNITY): Payer: Self-pay | Admitting: *Deleted

## 2024-09-22 DIAGNOSIS — F1210 Cannabis abuse, uncomplicated: Secondary | ICD-10-CM

## 2024-09-22 DIAGNOSIS — F1090 Alcohol use, unspecified, uncomplicated: Secondary | ICD-10-CM

## 2024-09-22 DIAGNOSIS — F319 Bipolar disorder, unspecified: Secondary | ICD-10-CM

## 2024-10-28 ENCOUNTER — Ambulatory Visit (HOSPITAL_COMMUNITY): Admitting: Clinical

## 2024-11-17 ENCOUNTER — Ambulatory Visit (HOSPITAL_COMMUNITY): Admitting: Clinical

## 2024-12-02 ENCOUNTER — Ambulatory Visit (HOSPITAL_COMMUNITY): Admitting: Clinical

## 2024-12-07 ENCOUNTER — Telehealth (HOSPITAL_COMMUNITY): Admitting: Psychiatry
